# Patient Record
Sex: Male | Born: 1980
Health system: Southern US, Community
[De-identification: ages and names within clinical notes are randomized; demographics above are authoritative.]

## PROBLEM LIST (undated history)

## (undated) HISTORY — PX: WISDOM TOOTH EXTRACTION: SHX21

## (undated) HISTORY — PX: POLYPECTOMY: SHX149

---

## 2001-07-13 ENCOUNTER — Ambulatory Visit (HOSPITAL_COMMUNITY): Admission: RE | Admit: 2001-07-13 | Discharge: 2001-07-13 | Payer: Self-pay | Admitting: Family Medicine

## 2001-07-13 ENCOUNTER — Encounter: Payer: Self-pay | Admitting: Family Medicine

## 2005-06-15 ENCOUNTER — Ambulatory Visit: Payer: Self-pay | Admitting: Internal Medicine

## 2005-06-27 ENCOUNTER — Encounter: Admission: RE | Admit: 2005-06-27 | Discharge: 2005-09-08 | Payer: Self-pay | Admitting: Internal Medicine

## 2005-12-02 ENCOUNTER — Ambulatory Visit: Payer: Self-pay | Admitting: Internal Medicine

## 2005-12-22 ENCOUNTER — Ambulatory Visit: Payer: Self-pay | Admitting: Internal Medicine

## 2006-01-10 ENCOUNTER — Ambulatory Visit: Payer: Self-pay | Admitting: Endocrinology

## 2007-07-05 ENCOUNTER — Ambulatory Visit: Payer: Self-pay | Admitting: Internal Medicine

## 2007-07-05 ENCOUNTER — Encounter: Payer: Self-pay | Admitting: *Deleted

## 2007-07-05 DIAGNOSIS — E291 Testicular hypofunction: Secondary | ICD-10-CM | POA: Insufficient documentation

## 2007-07-05 DIAGNOSIS — E039 Hypothyroidism, unspecified: Secondary | ICD-10-CM | POA: Insufficient documentation

## 2007-07-05 DIAGNOSIS — M549 Dorsalgia, unspecified: Secondary | ICD-10-CM

## 2007-07-05 DIAGNOSIS — E349 Endocrine disorder, unspecified: Secondary | ICD-10-CM | POA: Insufficient documentation

## 2007-07-05 HISTORY — DX: Endocrine disorder, unspecified: E34.9

## 2007-07-05 HISTORY — DX: Hypothyroidism, unspecified: E03.9

## 2007-07-05 HISTORY — DX: Dorsalgia, unspecified: M54.9

## 2007-07-05 HISTORY — DX: Testicular hypofunction: E29.1

## 2007-07-27 ENCOUNTER — Ambulatory Visit: Payer: Self-pay | Admitting: Internal Medicine

## 2007-07-27 LAB — CONVERTED CEMR LAB
Sex Hormone Binding: 14 nmol/L (ref 13–71)
Testosterone Free: 123.1 pg/mL (ref 47.0–244.0)
Testosterone-% Free: 3 % — ABNORMAL HIGH (ref 1.6–2.9)
Testosterone: 409.65 ng/dL (ref 350–890)

## 2007-08-03 LAB — CONVERTED CEMR LAB
TSH: 1.12 microintl units/mL (ref 0.35–5.50)
Testosterone: 401.63 ng/dL (ref 350.00–890)

## 2016-09-04 DIAGNOSIS — J302 Other seasonal allergic rhinitis: Secondary | ICD-10-CM

## 2016-09-04 DIAGNOSIS — J3089 Other allergic rhinitis: Secondary | ICD-10-CM

## 2016-09-04 HISTORY — DX: Other seasonal allergic rhinitis: J30.2

## 2016-09-04 HISTORY — DX: Other allergic rhinitis: J30.89

## 2017-05-16 DIAGNOSIS — E785 Hyperlipidemia, unspecified: Secondary | ICD-10-CM

## 2017-05-16 DIAGNOSIS — IMO0001 Reserved for inherently not codable concepts without codable children: Secondary | ICD-10-CM | POA: Insufficient documentation

## 2017-05-16 DIAGNOSIS — Z0000 Encounter for general adult medical examination without abnormal findings: Secondary | ICD-10-CM | POA: Insufficient documentation

## 2017-05-16 HISTORY — DX: Reserved for inherently not codable concepts without codable children: IMO0001

## 2017-05-16 HISTORY — DX: Hyperlipidemia, unspecified: E78.5

## 2017-06-23 ENCOUNTER — Telehealth (HOSPITAL_COMMUNITY): Payer: Self-pay | Admitting: Lactation Services

## 2018-03-29 DIAGNOSIS — L239 Allergic contact dermatitis, unspecified cause: Secondary | ICD-10-CM

## 2018-03-29 HISTORY — DX: Allergic contact dermatitis, unspecified cause: L23.9

## 2018-08-09 ENCOUNTER — Ambulatory Visit: Payer: Self-pay | Admitting: Family Medicine

## 2019-01-28 ENCOUNTER — Emergency Department (HOSPITAL_COMMUNITY)
Admission: EM | Admit: 2019-01-28 | Discharge: 2019-01-29 | Disposition: A | Discharge status: Home / Self Care | Payer: Self-pay | Attending: Emergency Medicine | Admitting: Emergency Medicine

## 2019-01-28 ENCOUNTER — Other Ambulatory Visit: Payer: Self-pay

## 2019-01-28 ENCOUNTER — Encounter (HOSPITAL_COMMUNITY): Payer: Self-pay

## 2019-01-28 ENCOUNTER — Emergency Department (HOSPITAL_COMMUNITY): Payer: Self-pay

## 2019-01-28 DIAGNOSIS — M79602 Pain in left arm: Secondary | ICD-10-CM | POA: Insufficient documentation

## 2019-01-28 DIAGNOSIS — R0789 Other chest pain: Secondary | ICD-10-CM | POA: Insufficient documentation

## 2019-01-28 DIAGNOSIS — F419 Anxiety disorder, unspecified: Secondary | ICD-10-CM | POA: Insufficient documentation

## 2019-01-28 DIAGNOSIS — R079 Chest pain, unspecified: Secondary | ICD-10-CM

## 2019-01-28 DIAGNOSIS — R42 Dizziness and giddiness: Secondary | ICD-10-CM | POA: Insufficient documentation

## 2019-01-28 DIAGNOSIS — E039 Hypothyroidism, unspecified: Secondary | ICD-10-CM | POA: Insufficient documentation

## 2019-01-28 DIAGNOSIS — R61 Generalized hyperhidrosis: Secondary | ICD-10-CM | POA: Insufficient documentation

## 2019-01-28 LAB — BASIC METABOLIC PANEL
Anion gap: 9 (ref 5–15)
BUN: 14 mg/dL (ref 6–20)
CO2: 25 mmol/L (ref 22–32)
Calcium: 9.5 mg/dL (ref 8.9–10.3)
Chloride: 104 mmol/L (ref 98–111)
Creatinine, Ser: 1.08 mg/dL (ref 0.61–1.24)
GFR calc Af Amer: >60 mL/min (ref >60)
GFR calc non Af Amer: >60 mL/min (ref >60)
Glucose, Bld: 121 mg/dL — ABNORMAL HIGH (ref 70–99)
Potassium: 3.5 mmol/L (ref 3.5–5.1)
Sodium: 138 mmol/L (ref 135–145)

## 2019-01-28 LAB — CBC
HCT: 44.8 % (ref 39.0–52.0)
Hemoglobin: 15.4 g/dL (ref 13.0–17.0)
MCH: 32.9 pg (ref 26.0–34.0)
MCHC: 34.4 g/dL (ref 30.0–36.0)
MCV: 95.7 fL (ref 80.0–100.0)
Platelets: 192 10*3/uL (ref 150–400)
RBC: 4.68 MIL/uL (ref 4.22–5.81)
RDW: 12.5 % (ref 11.5–15.5)
WBC: 7.9 10*3/uL (ref 4.0–10.5)
nRBC: 0 % (ref 0.0–0.2)

## 2019-01-28 LAB — TROPONIN I: Troponin I: <0.03 ng/mL (ref <0.03)

## 2019-01-28 MED ORDER — SODIUM CHLORIDE 0.9% FLUSH: 3.0000 mL | Freq: Once | INTRAVENOUS | Status: DC

## 2019-01-28 NOTE — ED Triage Notes (Signed)
Pt states while sitting on the couch he had chest pressure with dizzy and lightheadedness for a few minutes; Pt denies pain on arrival.  Pt a&ox 4 on arrival. No complaints on arrival-Monique,RN

## 2019-01-29 LAB — TROPONIN I: Troponin I: <0.03 ng/mL (ref <0.03)

## 2019-01-29 NOTE — ED Provider Notes (Signed)
[facility] EMERGENCY DEPARTMENT Provider Note   CSN: [id number] Arrival date & time: 01/28/19  2126    History   Chief Complaint Chief Complaint  Patient presents with  . Chest Pain    HPI Carlos Hendricks is a 38 y.o. male.     The history is provided by the patient and medical records.  Chest Pain    38 year old male with history of hypothyroidism, presenting to the ED with chest pain.  States tonight around 9 PM he was sitting on the couch and had episode of left-sided chest pain, left arm pain, lightheadedness, and some mild sweating.  States this lasted for 1 minute or less before resolving spontaneously.  States he has felt fine for the past several hours but was very concerned by nature of his symptoms.  He has not had any recurrence of his symptoms.  He denies any known cardiac history.  Does have family cardiac history in both parents at older ages.  No sudden cardiac death.  He is not a smoker.  Denies illicit drug use.   No history of DVT or PE.  No fever, cough, URI symptoms, or sick contacts.  History reviewed. No pertinent past medical history.  Patient Active Problem List   Diagnosis Date Noted  . HYPOTHYROIDISM 07/05/2007  . HYPOGONADISM 07/05/2007  . BACK PAIN 07/05/2007    History reviewed. No pertinent surgical history.      Home Medications    Prior to Admission medications   Not on File    Family History No family history on file.  Social History Social History   Tobacco Use  . Smoking status: Not on file  Substance Use Topics  . Alcohol use: Not Currently  . Drug use: Not Currently     Allergies   Patient has no allergy information on record.   Review of Systems Review of Systems  Cardiovascular: Positive for chest pain.  All other systems reviewed and are negative.    Physical Exam Updated Vital Signs BP (!) 144/90 (BP Location: Right Arm)   Pulse (!) 105   Temp 98.2 F (36.8 C) (Oral)   Resp 18    Ht 5\' 10"  (1.778 m)   Wt 120.2 kg   SpO2 99%   BMI 38.02 kg/m   Physical Exam Vitals signs and nursing note reviewed.  Constitutional:      Appearance: He is well-developed.     Comments: obese  HENT:     Head: Normocephalic and atraumatic.  Eyes:     Conjunctiva/sclera: Conjunctivae normal.     Pupils: Pupils are equal, round, and reactive to light.  Neck:     Musculoskeletal: Normal range of motion.  Cardiovascular:     Rate and Rhythm: Normal rate and regular rhythm.     Heart sounds: Normal heart sounds.     Comments: HR variable between 97-105 during exam, sinus Pulmonary:     Effort: Pulmonary effort is normal.     Breath sounds: Normal breath sounds.  Abdominal:     General: Bowel sounds are normal.     Palpations: Abdomen is soft.  Musculoskeletal: Normal range of motion.  Skin:    General: Skin is warm and dry.  Neurological:     Mental Status: He is alert and oriented to person, place, and time.  Psychiatric:     Comments: Appears a little anxious during exam      ED Treatments / Results  Labs (all labs ordered are  listed, but only abnormal results are displayed) Labs Reviewed  BASIC METABOLIC PANEL - Abnormal; Notable for the following components:      Result Value   Glucose, Bld 121 (*)    All other components within normal limits  CBC  TROPONIN I  TROPONIN I    EKG EKG Interpretation  Date/Time:  Monday January 28 2019 21:28:40 EDT Ventricular Rate:  100 PR Interval:  144 QRS Duration: 106 QT Interval:  366 QTC Calculation: 472 R Axis:   93 Text Interpretation:  Normal sinus rhythm Rightward axis Incomplete right bundle branch block Borderline ECG intermittent QRS widening NO STEMI. No old tracing to compare Confirmed by Drema Pryardama, Pedro [phone]([id number]) on 01/28/2019 11:42:08 PM   Radiology Dg Chest 2 View  Result Date: 01/28/2019 CLINICAL DATA:  Chest pain EXAM: CHEST - 2 VIEW COMPARISON:  None. FINDINGS: Heart and mediastinal contours are within  normal limits. No focal opacities or effusions. No acute bony abnormality. IMPRESSION: No active cardiopulmonary disease. Electronically Signed   By: Charlett NoseKevin  Dover M.D.   On: 01/28/2019 22:22    Procedures Procedures (including critical care time)  Medications Ordered in ED Medications  sodium chloride flush (NS) 0.9 % injection 3 mL (3 mLs Intravenous Not Given 01/29/19 0123)     Initial Impression / Assessment and Plan / ED Course  I have reviewed the triage vital signs and the nursing notes.  Pertinent labs & imaging results that were available during my care of the patient were reviewed by me and considered in my medical decision making (see chart for details).  38 y.o. M here after episode of chest pain while sitting on the couch.  Reports left sided chest pain, left arm pain, lightheadedness, and sweating.  Symptoms lasted <1 min before resolving.  No recurence since and has been feeling fine.  EKG non-ischemic here.  Mild tachycardia, however patient admits he is nervous and appear mildly anxious.  Labs reassuring, trop negative.  CXR clear.  Patient without significant cardiac risk factors aside from obesity and family history.  Delta trop remains negative.  Low suspicion for ACS, PE, dissection, or other acute cardiac event.  Patient HR has returned to normal without intervention here.  Feel he is stable for discharge home.  Can follow-up as an OP.  He will return here for any new/acute changes.  Final Clinical Impressions(s) / ED Diagnoses   Final diagnoses:  Chest pain in adult    ED Discharge Orders    None       Garlon HatchetSanders,  M, PA-C 01/29/19 0125    Nira Connardama, Pedro Eduardo, MD 01/29/19 [phone]

## 2019-01-29 NOTE — Discharge Instructions (Signed)
Cardiac evaluation today was normal. You can return here for any new/acute changes.

## 2020-05-25 ENCOUNTER — Other Ambulatory Visit: Payer: Self-pay

## 2020-05-25 ENCOUNTER — Encounter (HOSPITAL_COMMUNITY): Payer: Self-pay | Admitting: Emergency Medicine

## 2020-05-25 ENCOUNTER — Emergency Department (HOSPITAL_COMMUNITY)
Admission: EM | Admit: 2020-05-25 | Discharge: 2020-05-25 | Disposition: A | Discharge status: Home / Self Care | Payer: BC Managed Care – PPO | Attending: Emergency Medicine | Admitting: Emergency Medicine

## 2020-05-25 ENCOUNTER — Emergency Department (HOSPITAL_COMMUNITY): Payer: BC Managed Care – PPO

## 2020-05-25 DIAGNOSIS — R079 Chest pain, unspecified: Secondary | ICD-10-CM | POA: Diagnosis present

## 2020-05-25 DIAGNOSIS — R200 Anesthesia of skin: Secondary | ICD-10-CM | POA: Diagnosis not present

## 2020-05-25 DIAGNOSIS — Z5321 Procedure and treatment not carried out due to patient leaving prior to being seen by health care provider: Secondary | ICD-10-CM | POA: Insufficient documentation

## 2020-05-25 LAB — BASIC METABOLIC PANEL
Anion gap: 11 (ref 5–15)
BUN: 11 mg/dL (ref 6–20)
CO2: 23 mmol/L (ref 22–32)
Calcium: 9.4 mg/dL (ref 8.9–10.3)
Chloride: 106 mmol/L (ref 98–111)
Creatinine, Ser: 1.09 mg/dL (ref 0.61–1.24)
GFR calc Af Amer: >60 mL/min (ref >60)
GFR calc non Af Amer: >60 mL/min (ref >60)
Glucose, Bld: 114 mg/dL — ABNORMAL HIGH (ref 70–99)
Potassium: 4 mmol/L (ref 3.5–5.1)
Sodium: 140 mmol/L (ref 135–145)

## 2020-05-25 LAB — TROPONIN I (HIGH SENSITIVITY)
Troponin I (High Sensitivity): <2 ng/L (ref <18)
Troponin I (High Sensitivity): <2 ng/L (ref <18)

## 2020-05-25 LAB — CBC
HCT: 44.4 % (ref 39.0–52.0)
Hemoglobin: 15 g/dL (ref 13.0–17.0)
MCH: 33.3 pg (ref 26.0–34.0)
MCHC: 33.8 g/dL (ref 30.0–36.0)
MCV: 98.7 fL (ref 80.0–100.0)
Platelets: 211 10*3/uL (ref 150–400)
RBC: 4.5 MIL/uL (ref 4.22–5.81)
RDW: 12.2 % (ref 11.5–15.5)
WBC: 7.6 10*3/uL (ref 4.0–10.5)
nRBC: 0 % (ref 0.0–0.2)

## 2020-05-25 NOTE — ED Notes (Signed)
Pt gave stickers to another NT and stated he would follow up with primary in the morning.

## 2020-05-25 NOTE — ED Triage Notes (Signed)
Pt to ED with c/o left chest pain off and on describes as sharp   Also st's left hand felt numb.  Pt denies any pain at this time

## 2020-05-26 ENCOUNTER — Encounter (HOSPITAL_COMMUNITY): Payer: Self-pay

## 2020-05-26 ENCOUNTER — Emergency Department (HOSPITAL_COMMUNITY)
Admission: EM | Admit: 2020-05-26 | Discharge: 2020-05-26 | Disposition: A | Discharge status: Home / Self Care | Payer: BC Managed Care – PPO | Attending: Emergency Medicine | Admitting: Emergency Medicine

## 2020-05-26 ENCOUNTER — Other Ambulatory Visit: Payer: Self-pay

## 2020-05-26 DIAGNOSIS — R079 Chest pain, unspecified: Secondary | ICD-10-CM

## 2020-05-26 DIAGNOSIS — Z7982 Long term (current) use of aspirin: Secondary | ICD-10-CM | POA: Diagnosis not present

## 2020-05-26 DIAGNOSIS — R200 Anesthesia of skin: Secondary | ICD-10-CM | POA: Diagnosis not present

## 2020-05-26 DIAGNOSIS — R002 Palpitations: Secondary | ICD-10-CM | POA: Diagnosis not present

## 2020-05-26 DIAGNOSIS — E039 Hypothyroidism, unspecified: Secondary | ICD-10-CM | POA: Diagnosis not present

## 2020-05-26 DIAGNOSIS — R0789 Other chest pain: Secondary | ICD-10-CM | POA: Insufficient documentation

## 2020-05-26 DIAGNOSIS — R42 Dizziness and giddiness: Secondary | ICD-10-CM | POA: Diagnosis not present

## 2020-05-26 LAB — TROPONIN I (HIGH SENSITIVITY)
Troponin I (High Sensitivity): <2 ng/L (ref <18)
Troponin I (High Sensitivity): <2 ng/L (ref <18)

## 2020-05-26 NOTE — ED Provider Notes (Signed)
[facility] EMERGENCY DEPARTMENT Provider Note   CSN: [id number] Arrival date & time: 05/26/20  1354     History No chief complaint on file.   BARBARA KENG is a 39 y.o. male.  He has no significant past medical history.  Non-smoker.  Complaining of a few episodes over the last few days feeling a skipped beat and some sharp left-sided chest pain with associated lightheadedness and numbness in his left arm.  Was here yesterday for same had 2 troponins in the waiting room and ultimately left.  Saw the doctor at [facility] today and was told he is having an acute coronary syndrome and should need to come back to the ER.  Patient currently denies any symptoms.  He had a full set of labs yesterday with 2 troponins and a chest x-ray.  He has had a troponin here today and a second 1 is pending.  No prior history of coronary disease.  Denies any cocaine.    The history is provided by the patient and the spouse.  Chest Pain Pain location:  L chest Pain quality: sharp and stabbing   Pain radiates to:  Does not radiate Pain severity:  Moderate Onset quality:  Sudden Timing:  Sporadic Progression:  Unchanged Chronicity:  New Context: at rest   Relieved by:  Nothing Worsened by:  Nothing Ineffective treatments:  None tried Associated symptoms: dizziness, nausea, numbness and palpitations   Associated symptoms: no abdominal pain, no cough, no diaphoresis, no fever, no lower extremity edema, no shortness of breath, no vomiting and no weakness   Risk factors: male sex   Risk factors: no hypertension and no smoking     HPI: A 39 year old patient with a history of hypercholesterolemia and obesity presents for evaluation of chest pain. Initial onset of pain was more than 6 hours ago. The patient's chest pain is sharp and is not worse with exertion. The patient complains of nausea. The patient's chest pain is middle- or left-sided, is not well-localized, is not  described as heaviness/pressure/tightness and does not radiate to the arms/jaw/neck. The patient denies diaphoresis. The patient has no history of stroke, has no history of peripheral artery disease, has not smoked in the past 90 days, denies any history of treated diabetes, has no relevant family history of coronary artery disease (first degree relative at less than age 74) and is not hypertensive.   History reviewed. No pertinent past medical history.  Patient Active Problem List   Diagnosis Date Noted  . HYPOTHYROIDISM 07/05/2007  . HYPOGONADISM 07/05/2007  . BACK PAIN 07/05/2007    History reviewed. No pertinent surgical history.     No family history on file.  Social History   Tobacco Use  . Smoking status: Never Smoker  . Smokeless tobacco: Never Used  Substance Use Topics  . Alcohol use: Yes    Comment: rarely  . Drug use: Not Currently    Home Medications Prior to Admission medications   Medication Sig Start Date End Date Taking? Authorizing Provider  aspirin 81 MG chewable tablet Chew 243 mg by mouth once.    [provider]    Allergies    Patient has no known allergies.  Review of Systems   Review of Systems  Constitutional: Negative for diaphoresis and fever.  HENT: Negative for sore throat.   Eyes: Negative for visual disturbance.  Respiratory: Negative for cough and shortness of breath.   Cardiovascular: Positive for chest pain and palpitations.  Gastrointestinal: Positive for nausea. Negative for abdominal pain and vomiting.  Genitourinary: Negative for dysuria.  Musculoskeletal: Negative for neck pain.  Skin: Negative for rash.  Neurological: Positive for dizziness and numbness. Negative for weakness.    Physical Exam Updated Vital Signs BP (!) 139/92   Pulse 87   Temp 98.1 F (36.7 C) (Oral)   Resp 17   Ht 5\' 10"  (1.778 m)   Wt 131.5 kg   SpO2 100%   BMI 41.61 kg/m   Physical Exam Vitals and nursing note reviewed.    Constitutional:      Appearance: Normal appearance. He is well-developed.  HENT:     Head: Normocephalic and atraumatic.  Eyes:     Conjunctiva/sclera: Conjunctivae normal.  Cardiovascular:     Rate and Rhythm: Normal rate and regular rhythm.     Heart sounds: No murmur heard.   Pulmonary:     Effort: Pulmonary effort is normal. No respiratory distress.     Breath sounds: Normal breath sounds.  Abdominal:     Palpations: Abdomen is soft.     Tenderness: There is no abdominal tenderness.  Musculoskeletal:        General: Normal range of motion.     Cervical back: Neck supple.     Right lower leg: No edema.     Left lower leg: No edema.     Comments: No cords  Skin:    General: Skin is warm and dry.     Capillary Refill: Capillary refill takes less than 2 seconds.  Neurological:     General: No focal deficit present.     Mental Status: He is alert.     ED Results / Procedures / Treatments   Labs (all labs ordered are listed, but only abnormal results are displayed) Labs Reviewed  TROPONIN I (HIGH SENSITIVITY)  TROPONIN I (HIGH SENSITIVITY)    EKG EKG Interpretation  Date/Time:  Tuesday May 26 2020 14:01:52 EDT Ventricular Rate:  78 PR Interval:  150 QRS Duration: 128 QT Interval:  402 QTC Calculation: 458 R Axis:   98 Text Interpretation: Normal sinus rhythm Right bundle branch block Abnormal ECG No significant change since prior yesterday Confirmed by 01-15-2004 [phone]) on 05/26/2020 8:12:48 PM   Radiology DG Chest 2 View  Result Date: 05/25/2020 CLINICAL DATA:  Chest pain EXAM: CHEST - 2 VIEW COMPARISON:  01/28/2019 chest radiograph. FINDINGS: Stable cardiomediastinal silhouette with normal heart size. No pneumothorax. No pleural effusion. Lungs appear clear, with no acute consolidative airspace disease and no pulmonary edema. IMPRESSION: No active cardiopulmonary disease. Electronically Signed   By: 01/30/2019 M.D.   On: 05/25/2020 20:20     Procedures Procedures (including critical care time)  Medications Ordered in ED Medications - No data to display  ED Course  I have reviewed the triage vital signs and the nursing notes.  Pertinent labs & imaging results that were available during my care of the patient were reviewed by me and considered in my medical decision making (see chart for details).  Clinical Course as of May 27 1046  Tue May 26, 2020  2035 He is PERC negative.   [MB]  2054 Reviewed case with cardiology on-call Dr. [id number].  He did not feel that the progression to a full right bundle branch block was indicative of coronary disease.  He is in agreement with plan for outpatient referral to cardiology.   [MB]    Clinical Course User Index [MB] Izora Ribas, MD  MDM Rules/Calculators/A&P HEAR Score: 2                       This patient complains of palpitations chest pain lightheadedness; this involves an extensive number of treatment Options and is a complaint that carries with it a high risk of complications and Morbidity. The differential includes ACS, arrhythmia, pneumothorax, pneumonia, GERD, vascular, PE  I ordered, reviewed and interpreted labs, which included 2 troponins unremarkable I reviewed the chest x-ray had lab work done yesterday that was unremarkable Additional history obtained from patient's spouse Previous records obtained and reviewed in epic, did have an ED visit a year ago for chest pain and ruled out with 2 troponins  After the interventions stated above, I reevaluated the patient and found the patient currently be pain-free with normal vitals.  I reviewed his work-up with him and his wife.  He is comfortable with plan for outpatient referral to cardiology.  Return instructions discussed.   Final Clinical Impression(s) / ED Diagnoses Final diagnoses:  Nonspecific chest pain  Heart palpitations    Rx / DC Orders ED Discharge Orders         Ordered    Ambulatory  referral to Cardiology     Discontinue  Reprint     05/26/20 2118           Terrilee Files, MD 05/27/20 1049

## 2020-05-26 NOTE — Discharge Instructions (Addendum)
You were seen in the emergency department for evaluation of chest pain palpitations lightheadedness.  Between yesterday and today you had blood work chest x-ray EKG.  There was no evidence of any cardiac injury.  We have placed a referral for you to follow-up with cardiology.  Please take a baby aspirin a day.  Return to the emergency department with any worsening or concerning symptoms.

## 2020-05-26 NOTE — ED Triage Notes (Signed)
Patient here yesterday with CP and left prior to being seen. Went to his MD today and sent back to ED for abnormal EKG. No pain but reports intermittent light headedness. Alert and oriented, NAD

## 2020-05-27 ENCOUNTER — Encounter: Payer: Self-pay | Admitting: *Deleted

## 2020-05-27 ENCOUNTER — Ambulatory Visit: Payer: BC Managed Care – PPO | Admitting: Cardiology

## 2020-05-27 ENCOUNTER — Other Ambulatory Visit: Payer: Self-pay | Admitting: *Deleted

## 2020-05-27 ENCOUNTER — Ambulatory Visit (INDEPENDENT_AMBULATORY_CARE_PROVIDER_SITE_OTHER): Payer: BC Managed Care – PPO

## 2020-05-27 VITALS — BP 104/76 | HR 88 | Ht 70.0 in | Wt 291.0 lb

## 2020-05-27 DIAGNOSIS — I4510 Unspecified right bundle-branch block: Secondary | ICD-10-CM

## 2020-05-27 DIAGNOSIS — R42 Dizziness and giddiness: Secondary | ICD-10-CM

## 2020-05-27 DIAGNOSIS — R079 Chest pain, unspecified: Secondary | ICD-10-CM | POA: Insufficient documentation

## 2020-05-27 DIAGNOSIS — R600 Localized edema: Secondary | ICD-10-CM | POA: Diagnosis not present

## 2020-05-27 DIAGNOSIS — E785 Hyperlipidemia, unspecified: Secondary | ICD-10-CM | POA: Diagnosis not present

## 2020-05-27 DIAGNOSIS — R072 Precordial pain: Secondary | ICD-10-CM | POA: Diagnosis not present

## 2020-05-27 MED ORDER — NITROGLYCERIN 0.4 MG SL SUBL: 0.4000 mg | SUBLINGUAL_TABLET | SUBLINGUAL | 6 refills | Status: DC | PRN

## 2020-05-27 MED ORDER — METOPROLOL TARTRATE 100 MG PO TABS: 100.0000 mg | ORAL_TABLET | Freq: Once | ORAL | 0 refills | Status: DC

## 2020-05-27 NOTE — Patient Instructions (Signed)
Medication Instructions:  Your physician has recommended you make the following change in your medication:   Take Nitroglycerin as needed for chest pain.  *If you need a refill on your cardiac medications before your next appointment, please call your pharmacy*   Lab Work: Your physician recommends that you return for lab work in: 1 week prior to your CT you will need a BMET. You also need a lipid profile when fasting.  You need to have labs done when you are fasting.  You can come Monday through Friday 8:30 am to 12:00 pm and 1:15 to 4:30. You do not need to make an appointment as the order has already been placed.   If you have labs (blood work) drawn today and your tests are completely normal, you will receive your results only by:  [place] (if you have MyChart) OR  A paper copy in the mail If you have any lab test that is abnormal or we need to change your treatment, we will call you to review the results.   Testing/Procedures: Your physician has requested that you have an echocardiogram. Echocardiography is a painless test that uses sound waves to create images of your heart. It provides your doctor with information about the size and shape of your heart and how well your hearts chambers and valves are working. This procedure takes approximately one hour. There are no restrictions for this procedure.   WHY IS MY DOCTOR PRESCRIBING ZIO? The Zio system is proven and trusted by physicians to detect and diagnose irregular heart rhythms -- and has been prescribed to hundreds of thousands of patients.  The FDA has cleared the Zio system to monitor for many different kinds of irregular heart rhythms. In a study, physicians were able to reach a diagnosis 90% of the time with the Zio system1.  You can wear the Zio monitor -- a small, discreet, comfortable patch -- during your normal day-to-day activity, including while you sleep, shower, and exercise, while it records every single  heartbeat for analysis.  1Barrett, P., et al. Comparison of 24 Hour Holter Monitoring Versus 14 Day Novel Adhesive Patch Electrocardiographic Monitoring. [place], 2014.  ZIO VS. HOLTER MONITORING The Zio monitor can be comfortably worn for up to 14 days. Holter monitors can be worn for 24 to 48 hours, limiting the time to record any irregular heart rhythms you may have. Zio is able to capture data for the 51% of patients who have their first symptom-triggered arrhythmia after 48 hours.1  LIVE WITHOUT RESTRICTIONS The Zio ambulatory cardiac monitor is a small, unobtrusive, and water-resistant patch--you might even forget youre wearing it. The Zio monitor records and stores every beat of your heart, whether you're sleeping, working out, or showering.  Wear the monitor for 2 weeks, remove 06/10/20.    Your cardiac CT will be scheduled at:   [facility] [place], [place] [address] [phone]   If scheduled at [facility], please arrive at the [facility] main entrance of [facility] 30 minutes prior to test start time. Proceed to the [facility] Radiology Department (first floor) to check-in and test prep.  Please follow these instructions carefully (unless otherwise directed):  Hold all erectile dysfunction medications at least 3 days (72 hrs) prior to test.  On the Night Before the Test:  Be sure to Drink plenty of water.  Do not consume any caffeinated/decaffeinated beverages or chocolate 12 hours prior to your test.  Do not take any antihistamines 12 hours prior to your test.  On the Day of the Test:  Drink plenty of water. Do not drink any water within one hour of the test.  Do not eat any food 4 hours prior to the test.  You may take your regular medications prior to the test.   Take metoprolol (Lopressor) two hours prior to test.    After the Test:  Drink plenty of water.  After receiving IV  contrast, you may experience a mild flushed feeling. This is normal.  On occasion, you may experience a mild rash up to 24 hours after the test. This is not dangerous. If this occurs, you can take Benadryl 25 mg and increase your fluid intake.  If you experience trouble breathing, this can be serious. If it is severe call 911 IMMEDIATELY. If it is mild, please call our office.  Once we have confirmed authorization from your insurance company, we will call you to set up a date and time for your test. Based on how quickly your insurance processes prior authorizations requests, please allow up to 4 weeks to be contacted for scheduling your Cardiac CT appointment. Be advised that routine Cardiac CT appointments could be scheduled as many as 8 weeks after your provider has ordered it.  For non-scheduling related questions, please contact the cardiac imaging nurse navigator should you have any questions/concerns: Marchia Bond, Cardiac Imaging Nurse Navigator Burley Saver, Interim Cardiac Imaging Nurse Lane and Vascular Services Direct Office Dial: [phone]   For scheduling needs, including cancellations and rescheduling, please call Vivien Rota at [phone].     Follow-Up: At [facility], you and your health needs are our priority.  As part of our continuing mission to provide you with exceptional heart care, we have created designated Provider Care Teams.  These Care Teams include your primary Cardiologist (physician) and Advanced Practice Providers (APPs -  Physician Assistants and Nurse Practitioners) who all work together to provide you with the care you need, when you need it.  We recommend signing up for the patient portal called "MyChart".  Sign up information is provided on this After Visit Summary.  MyChart is used to connect with patients for Virtual Visits (Telemedicine).  Patients are able to view lab/test results, encounter notes, upcoming appointments, etc.   Non-urgent messages can be sent to your provider as well.   To learn more about what you can do with MyChart, go to [email or web address].    Your next appointment:   3 month(s)  The format for your next appointment:   In Person  Provider:   Berniece Salines, DO   Other Instructions Cardiac CT Angiogram A cardiac CT angiogram is a procedure to look at the heart and the area around the heart. It may be done to help find the cause of chest pains or other symptoms of heart disease. During this procedure, a substance called contrast dye is injected into the blood vessels in the area to be checked. A large X-ray machine, called a CT scanner, then takes detailed pictures of the heart and the surrounding area. The procedure is also sometimes called a coronary CT angiogram, coronary artery scanning, or CTA. A cardiac CT angiogram allows the health care provider to see how well blood is flowing to and from the heart. The health care provider will be able to see if there are any problems, such as:  Blockage or narrowing of the coronary arteries in the heart.  Fluid  around the heart.  Signs of weakness or disease in the muscles, valves, and tissues of the heart. Tell a health care provider about:  Any allergies you have. This is especially important if you have had a previous allergic reaction to contrast dye.  All medicines you are taking, including vitamins, herbs, eye drops, creams, and over-the-counter medicines.  Any blood disorders you have.  Any surgeries you have had.  Any medical conditions you have.  Whether you are pregnant or may be pregnant.  Any anxiety disorders, chronic pain, or other conditions you have that may increase your stress or prevent you from lying still. What are the risks? Generally, this is a safe procedure. However, problems may occur, including: 1. Bleeding. 2. Infection. 3. Allergic reactions to medicines or dyes. 4. Damage to other structures or  organs. 5. Kidney damage from the contrast dye that is used. 6. Increased risk of cancer from radiation exposure. This risk is low. Talk with your health care provider about: ? The risks and benefits of testing. ? How you can receive the lowest dose of radiation. What happens before the procedure? 1. Wear comfortable clothing and remove any jewelry, glasses, dentures, and hearing aids. 2. Follow instructions from your health care provider about eating and drinking. This may include: ? For 12 hours before the procedure -- avoid caffeine. This includes tea, coffee, soda, energy drinks, and diet pills. Drink plenty of water or other fluids that do not have caffeine in them. Being well hydrated can prevent complications. ? For 4-6 hours before the procedure -- stop eating and drinking. The contrast dye can cause nausea, but this is less likely if your stomach is empty. 3. Ask your health care provider about changing or stopping your regular medicines. This is especially important if you are taking diabetes medicines, blood thinners, or medicines to treat problems with erections (erectile dysfunction). What happens during the procedure?  1. Hair on your chest may need to be removed so that small sticky patches called electrodes can be placed on your chest. These will transmit information that helps to monitor your heart during the procedure. 2. An IV will be inserted into one of your veins. 3. You might be given a medicine to control your heart rate during the procedure. This will help to ensure that good images are obtained. 4. You will be asked to lie on an exam table. This table will slide in and out of the CT machine during the procedure. 5. Contrast dye will be injected into the IV. You might feel warm, or you may get a metallic taste in your mouth. 6. You will be given a medicine called nitroglycerin. This will relax or dilate the arteries in your heart. 7. The table that you are lying on will  move into the CT machine tunnel for the scan. 8. The person running the machine will give you instructions while the scans are being done. You may be asked to: ? Keep your arms above your head. ? Hold your breath. ? Stay very still, even if the table is moving. 9. When the scanning is complete, you will be moved out of the machine. 10. The IV will be removed. The procedure may vary among health care providers and hospitals. What can I expect after the procedure? After your procedure, it is common to have:  A metallic taste in your mouth from the contrast dye.  A feeling of warmth.  A headache from the nitroglycerin. Follow these instructions at  home:  Take over-the-counter and prescription medicines only as told by your health care provider.  If you are told, drink enough fluid to keep your urine pale yellow. This will help to flush the contrast dye out of your body.  Most people can return to their normal activities right after the procedure. Ask your health care provider what activities are safe for you.  It is up to you to get the results of your procedure. Ask your health care provider, or the department that is doing the procedure, when your results will be ready.  Keep all follow-up visits as told by your health care provider. This is important. Contact a health care provider if: 1. You have any symptoms of allergy to the contrast dye. These include: ? Shortness of breath. ? Rash or hives. ? A racing heartbeat. Summary  A cardiac CT angiogram is a procedure to look at the heart and the area around the heart. It may be done to help find the cause of chest pains or other symptoms of heart disease.  During this procedure, a large X-ray machine, called a CT scanner, takes detailed pictures of the heart and the surrounding area after a contrast dye has been injected into blood vessels in the area.  Ask your health care provider about changing or stopping your regular medicines  before the procedure. This is especially important if you are taking diabetes medicines, blood thinners, or medicines to treat erectile dysfunction.  If you are told, drink enough fluid to keep your urine pale yellow. This will help to flush the contrast dye out of your body. This information is not intended to replace advice given to you by your health care provider. Make sure you discuss any questions you have with your health care provider. Document Revised: 05/22/2019 Document Reviewed: 05/22/2019 Elsevier Patient Education  [place].  Nitroglycerin sublingual tablets What is this medicine? NITROGLYCERIN (nye troe GLI ser in) is a type of vasodilator. It relaxes blood vessels, increasing the blood and oxygen supply to your heart. This medicine is used to relieve chest pain caused by angina. It is also used to prevent chest pain before activities like climbing stairs, going outdoors in cold weather, or sexual activity. This medicine may be used for other purposes; ask your health care provider or pharmacist if you have questions. COMMON BRAND NAME(S): Nitroquick, Nitrostat, Nitrotab What should I tell my health care provider before I take this medicine? They need to know if you have any of these conditions:  anemia  head injury, recent stroke, or bleeding in the brain  liver disease  previous heart attack  an unusual or allergic reaction to nitroglycerin, other medicines, foods, dyes, or preservatives  pregnant or trying to get pregnant  breast-feeding How should I use this medicine? Take this medicine by mouth as needed. At the first sign of an angina attack (chest pain or tightness) place one tablet under your tongue. You can also take this medicine 5 to 10 minutes before an event likely to produce chest pain. Follow the directions on the prescription label. Let the tablet dissolve under the tongue. Do not swallow whole. Replace the dose if you accidentally swallow it. It  will help if your mouth is not dry. Saliva around the tablet will help it to dissolve more quickly. Do not eat or drink, smoke or chew tobacco while a tablet is dissolving. If you are not better within 5 minutes after taking ONE dose of nitroglycerin, call 9-1-1  immediately to seek emergency medical care. Do not take more than 3 nitroglycerin tablets over 15 minutes. If you take this medicine often to relieve symptoms of angina, your doctor or health care professional may provide you with different instructions to manage your symptoms. If symptoms do not go away after following these instructions, it is important to call 9-1-1 immediately. Do not take more than 3 nitroglycerin tablets over 15 minutes. Talk to your pediatrician regarding the use of this medicine in children. Special care may be needed. Overdosage: If you think you have taken too much of this medicine contact a poison control center or emergency room at once. NOTE: This medicine is only for you. Do not share this medicine with others. What if I miss a dose? This does not apply. This medicine is only used as needed. What may interact with this medicine? Do not take this medicine with any of the following medications:  certain migraine medicines like ergotamine and dihydroergotamine (DHE)  medicines used to treat erectile dysfunction like sildenafil, tadalafil, and vardenafil  riociguat This medicine may also interact with the following medications:  alteplase  aspirin  heparin  medicines for high blood pressure  medicines for mental depression  other medicines used to treat angina  phenothiazines like chlorpromazine, mesoridazine, prochlorperazine, thioridazine This list may not describe all possible interactions. Give your health care provider a list of all the medicines, herbs, non-prescription drugs, or dietary supplements you use. Also tell them if you smoke, drink alcohol, or use illegal drugs. Some items may interact  with your medicine. What should I watch for while using this medicine? Tell your doctor or health care professional if you feel your medicine is no longer working. Keep this medicine with you at all times. Sit or lie down when you take your medicine to prevent falling if you feel dizzy or faint after using it. Try to remain calm. This will help you to feel better faster. If you feel dizzy, take several deep breaths and lie down with your feet propped up, or bend forward with your head resting between your knees. You may get drowsy or dizzy. Do not drive, use machinery, or do anything that needs mental alertness until you know how this drug affects you. Do not stand or sit up quickly, especially if you are an older patient. This reduces the risk of dizzy or fainting spells. Alcohol can make you more drowsy and dizzy. Avoid alcoholic drinks. Do not treat yourself for coughs, colds, or pain while you are taking this medicine without asking your doctor or health care professional for advice. Some ingredients may increase your blood pressure. What side effects may I notice from receiving this medicine? Side effects that you should report to your doctor or health care professional as soon as possible:  blurred vision  dry mouth  skin rash  sweating  the feeling of extreme pressure in the head  unusually weak or tired Side effects that usually do not require medical attention (report to your doctor or health care professional if they continue or are bothersome):  flushing of the face or neck  headache  irregular heartbeat, palpitations  nausea, vomiting This list may not describe all possible side effects. Call your doctor for medical advice about side effects. You may report side effects to FDA at 1-800-FDA-1088. Where should I keep my medicine? Keep out of the reach of children. Store at room temperature between 20 and 25 degrees C (68 and 77 degrees F). Store  in original container.  Protect from light and moisture. Keep tightly closed. Throw away any unused medicine after the expiration date. NOTE: This sheet is a summary. It may not cover all possible information. If you have questions about this medicine, talk to your doctor, pharmacist, or health care provider.  2020 Elsevier/Gold Standard (2013-07-25 17:57:36)  Echocardiogram An echocardiogram is a procedure that uses painless sound waves (ultrasound) to produce an image of the heart. Images from an echocardiogram can provide important information about:  Signs of coronary artery disease (CAD).  Aneurysm detection. An aneurysm is a weak or damaged part of an artery wall that bulges out from the normal force of blood pumping through the body.  Heart size and shape. Changes in the size or shape of the heart can be associated with certain conditions, including heart failure, aneurysm, and CAD.  Heart muscle function.  Heart valve function.  Signs of a past heart attack.  Fluid buildup around the heart.  Thickening of the heart muscle.  A tumor or infectious growth around the heart valves. Tell a health care provider about:  Any allergies you have.  All medicines you are taking, including vitamins, herbs, eye drops, creams, and over-the-counter medicines.  Any blood disorders you have.  Any surgeries you have had.  Any medical conditions you have.  Whether you are pregnant or may be pregnant. What are the risks? Generally, this is a safe procedure. However, problems may occur, including:  Allergic reaction to dye (contrast) that may be used during the procedure. What happens before the procedure? No specific preparation is needed. You may eat and drink normally. What happens during the procedure?   An IV tube may be inserted into one of your veins.  You may receive contrast through this tube. A contrast is an injection that improves the quality of the pictures from your heart.  A gel will be  applied to your chest.  A wand-like tool (transducer) will be moved over your chest. The gel will help to transmit the sound waves from the transducer.  The sound waves will harmlessly bounce off of your heart to allow the heart images to be captured in real-time motion. The images will be recorded on a computer. The procedure may vary among health care providers and hospitals. What happens after the procedure?  You may return to your normal, everyday life, including diet, activities, and medicines, unless your health care provider tells you not to do that. Summary  An echocardiogram is a procedure that uses painless sound waves (ultrasound) to produce an image of the heart.  Images from an echocardiogram can provide important information about the size and shape of your heart, heart muscle function, heart valve function, and fluid buildup around your heart.  You do not need to do anything to prepare before this procedure. You may eat and drink normally.  After the echocardiogram is completed, you may return to your normal, everyday life, unless your health care provider tells you not to do that. This information is not intended to replace advice given to you by your health care provider. Make sure you discuss any questions you have with your health care provider. Document Revised: 01/17/2019 Document Reviewed: 10/29/2016 Elsevier Patient Education  [place].

## 2020-05-27 NOTE — Progress Notes (Signed)
Cardiology Office Note:    Date:  05/27/2020   ID:  Carlos Hendricks, DOB December 06, 1980, MRN [id number]  PCP:  Patient, No Pcp Per  Cardiologist:  No primary care provider on file.  Electrophysiologist:  None   Referring MD: Hayden Rasmussen, MD   Chief Complaint  Patient presents with  . Chest Pain   History of Present Illness:    Carlos Hendricks is a 39 y.o. male with a hx of hypertriglyceridemia was on medication in the past but no longer. He has not had his lipid profile done in over a year. The patient presents today to be evaluated for chest pain, lightheadedness/dizziness and chest flutter.  He tells me that he started last Thursday. He notes that he was driving back from work when he felt some intermittent chest flutter at which time he felt significantly lightheaded with some nausea. He stated it lasted for few minutes and then resolved. He didn't think much about it. The next day on Friday again while driving he felt sounded onset of lightheadedness he felt sweaty and at that time he felt some jolt in his chest but resolved. He did speak with his wife about the second episode therefore this started to wash the patient. On Saturday he tells me he was fine and is moving in his yard and had no problems.   On Monday he took his car to get fixed and while in another person's car he started to feel left-sided chest pain with left arm numbness. He then proceeded to go to the emergency department to be evaluated. While in emergency department he had serial troponins which were all reported to be within normal limits. His EKG showed evidence of right bundle branch block with no ST-T wave changes.  Patient is in office today with his wife. He is not experiencing any chest pain but states few minutes prior to my presenting to the room for encounter he had some lightheadedness.  Past Medical History:  Diagnosis Date  . Allergic contact dermatitis 03/29/2018  . BACK PAIN 07/05/2007    Qualifier: Diagnosis of  By: Larose Kells MD, [place] 3 obesity with body mass index (BMI) of 40.0 to 44.9 in adult 05/16/2017  . Hyperlipidemia 05/16/2017  . HYPOGONADISM 07/05/2007   Qualifier: Diagnosis of  By: Reatha Armour, Lucy    . Hypotestosteronism 07/05/2007  . HYPOTHYROIDISM 07/05/2007   Qualifier: Diagnosis of  By: Reatha Armour, Lucy    . Perennial allergic rhinitis with seasonal variation 09/04/2016    Past Surgical History:  Procedure Laterality Date  . POLYPECTOMY    . WISDOM TOOTH EXTRACTION      Current Medications: No outpatient medications have been marked as taking for the 05/27/20 encounter (Office Visit) with Berniece Salines, DO.     Allergies:   Patient has no known allergies.   Social History   Socioeconomic History  . Marital status: Married    Spouse name: Not on file  . Number of children: Not on file  . Years of education: Not on file  . Highest education level: Not on file  Occupational History  . Not on file  Tobacco Use  . Smoking status: Never Smoker  . Smokeless tobacco: Never Used  Substance and Sexual Activity  . Alcohol use: Yes    Comment: rarely  . Drug use: Not Currently  . Sexual activity: Not on file  Other Topics Concern  . Not on file  Social History Narrative  .  Not on file   Social Determinants of Health   Financial Resource Strain:   . Difficulty of Paying Living Expenses:   Food Insecurity:   . Worried About Charity fundraiser in the Last Year:   . Arboriculturist in the Last Year:   Transportation Needs:   . Film/video editor (Medical):   Marland Kitchen Lack of Transportation (Non-Medical):   Physical Activity:   . Days of Exercise per Week:   . Minutes of Exercise per Session:   Stress:   . Feeling of Stress :   Social Connections:   . Frequency of Communication with Friends and Family:   . Frequency of Social Gatherings with Friends and Family:   . Attends Religious Services:   . Active Member of Clubs or Organizations:   .  Attends Archivist Meetings:   Marland Kitchen Marital Status:      Family History: The patient's family history includes Diabetes in his father and paternal grandfather; Heart disease in his mother; Leukemia in his paternal grandfather.  ROS:   Review of Systems  Constitution: Negative for decreased appetite, fever and weight gain.  HENT: Negative for congestion, ear discharge, hoarse voice and sore throat.   Eyes: Negative for discharge, redness, vision loss in right eye and visual halos.  Cardiovascular: Negative for chest pain, dyspnea on exertion, leg swelling, orthopnea and palpitations.  Respiratory: Negative for cough, hemoptysis, shortness of breath and snoring.   Endocrine: Negative for heat intolerance and polyphagia.  Hematologic/Lymphatic: Negative for bleeding problem. Does not bruise/bleed easily.  Skin: Negative for flushing, nail changes, rash and suspicious lesions.  Musculoskeletal: Negative for arthritis, joint pain, muscle cramps, myalgias, neck pain and stiffness.  Gastrointestinal: Negative for abdominal pain, bowel incontinence, diarrhea and excessive appetite.  Genitourinary: Negative for decreased libido, genital sores and incomplete emptying.  Neurological: Negative for brief paralysis, focal weakness, headaches and loss of balance.  Psychiatric/Behavioral: Negative for altered mental status, depression and suicidal ideas.  Allergic/Immunologic: Negative for HIV exposure and persistent infections.    EKGs/Labs/Other Studies Reviewed:    The following studies were reviewed today:   EKG:  The ekg ordered today demonstrates sinus rhythm, heart rate 88 bpm with underlying right bundle branch block similar to prior EKG.  Recent Labs: 05/25/2020: BUN 11; Creatinine, Ser 1.09; Hemoglobin 15.0; Platelets 211; Potassium 4.0; Sodium 140  Recent Lipid Panel No results found for: CHOL, TRIG, HDL, CHOLHDL, VLDL, LDLCALC, LDLDIRECT  Physical Exam:    VS:  BP 104/76 (BP  Location: Left Arm, Patient Position: Sitting, Cuff Size: Large)   Pulse 88   Ht 5' 10"  (1.778 m)   Wt 291 lb (132 kg)   SpO2 96%   BMI 41.75 kg/m     Wt Readings from Last 3 Encounters:  05/27/20 291 lb (132 kg)  05/26/20 290 lb (131.5 kg)  05/25/20 290 lb (131.5 kg)     GEN: Well nourished, well developed in no acute distress HEENT: Normal NECK: No JVD; No carotid bruits LYMPHATICS: No lymphadenopathy CARDIAC: S1S2 noted,RRR, no murmurs, rubs, gallops RESPIRATORY:  Clear to auscultation without rales, wheezing or rhonchi  ABDOMEN: Soft, non-tender, non-distended, +bowel sounds, no guarding. EXTREMITIES: No edema, No cyanosis, no clubbing MUSCULOSKELETAL:  No deformity  SKIN: Warm and dry NEUROLOGIC:  Alert and oriented x 3, non-focal PSYCHIATRIC:  Normal affect, good insight  ASSESSMENT:    1. Precordial pain   2. Bilateral leg edema   3. Dizziness   4. Hyperlipidemia,  unspecified hyperlipidemia type   5. Morbid obesity ([place])   6. Right bundle branch block    PLAN:    His chest pain is concerning given the multiple symptoms. He does have some risk factors low to intermediate therefore at this time like to proceed with a coronary CTA in this patient. He has no IV dye contrast allergy .Educated patient about this testing. He is willing to proceed.  In terms of his dizziness/lightheadedness I would like to rule out a cardiovascular etiology of this palpitation, therefore at this time I would like to placed a zio patch for   14 days. In additon a transthoracic echocardiogram will be ordered to assess LV/RV function and any structural abnormalities. Once these testing have been performed amd reviewed further reccomendations will be made. For now, I do reccomend that the patient goes to the nearest ED if  symptoms recur.  His physical exam show evidence of bilateral +1 leg edema an echocardiogram will be performed as stated above.  The patient understands the need to lose  weight with diet and exercise. We have discussed specific strategies for this.  He will need to get a lipid profile which I have advised the patient come on a day fasting to get this testing done.  The patient is in agreement with the above plan. The patient left the office in stable condition.  The patient will follow up in 3 months or sooner if needed.   Medication Adjustments/Labs and Tests Ordered: Current medicines are reviewed at length with the patient today.  Concerns regarding medicines are outlined above.  Orders Placed This Encounter  Procedures  . CT CORONARY MORPH W/CTA COR W/SCORE W/CA W/CM &/OR WO/CM  . CT CORONARY FRACTIONAL FLOW RESERVE DATA PREP  . CT CORONARY FRACTIONAL FLOW RESERVE FLUID ANALYSIS  . Lipid panel  . Basic metabolic panel  . LONG TERM MONITOR (3-14 DAYS)  . EKG 12-Lead  . ECHOCARDIOGRAM COMPLETE   Meds ordered this encounter  Medications  . nitroGLYCERIN (NITROSTAT) 0.4 MG SL tablet    Sig: Place 1 tablet (0.4 mg total) under the tongue every 5 (five) minutes as needed.    Dispense:  25 tablet    Refill:  6  . metoprolol tartrate (LOPRESSOR) 100 MG tablet    Sig: Take 1 tablet (100 mg total) by mouth once for 1 dose. Take 2 hours prior to your CT if your heart rate is greater than 55    Dispense:  1 tablet    Refill:  0    Patient Instructions  Medication Instructions:  Your physician has recommended you make the following change in your medication:   Take Nitroglycerin as needed for chest pain.  *If you need a refill on your cardiac medications before your next appointment, please call your pharmacy*   Lab Work: Your physician recommends that you return for lab work in: 1 week prior to your CT you will need a BMET. You also need a lipid profile when fasting.  You need to have labs done when you are fasting.  You can come Monday through Friday 8:30 am to 12:00 pm and 1:15 to 4:30. You do not need to make an appointment as the order has  already been placed.   If you have labs (blood work) drawn today and your tests are completely normal, you will receive your results only by: Marland Kitchen MyChart Message (if you have MyChart) OR . A paper copy in the mail If you have any  lab test that is abnormal or we need to change your treatment, we will call you to review the results.   Testing/Procedures: Your physician has requested that you have an echocardiogram. Echocardiography is a painless test that uses sound waves to create images of your heart. It provides your doctor with information about the size and shape of your heart and how well your heart's chambers and valves are working. This procedure takes approximately one hour. There are no restrictions for this procedure.   WHY IS MY DOCTOR PRESCRIBING ZIO? The Zio system is proven and trusted by physicians to detect and diagnose irregular heart rhythms -- and has been prescribed to hundreds of thousands of patients.  The FDA has cleared the Zio system to monitor for many different kinds of irregular heart rhythms. In a study, physicians were able to reach a diagnosis 90% of the time with the Zio system1.  You can wear the Zio monitor -- a small, discreet, comfortable patch -- during your normal day-to-day activity, including while you sleep, shower, and exercise, while it records every single heartbeat for analysis.  1Barrett, P., et al. Comparison of 24 Hour Holter Monitoring Versus 14 Day Novel Adhesive Patch Electrocardiographic Monitoring. [place], 2014.  ZIO VS. HOLTER MONITORING The Zio monitor can be comfortably worn for up to 14 days. Holter monitors can be worn for 24 to 48 hours, limiting the time to record any irregular heart rhythms you may have. Zio is able to capture data for the 51% of patients who have their first symptom-triggered arrhythmia after 48 hours.1  LIVE WITHOUT RESTRICTIONS The Zio ambulatory cardiac monitor is a small, unobtrusive, and  water-resistant patch--you might even forget you're wearing it. The Zio monitor records and stores every beat of your heart, whether you're sleeping, working out, or showering.  Wear the monitor for 2 weeks, remove 06/10/20.    Your cardiac CT will be scheduled at:   [facility] [place], [place] [address] [phone]   If scheduled at [facility], please arrive at the [facility] main entrance of [facility] 30 minutes prior to test start time. Proceed to the [facility] Radiology Department (first floor) to check-in and test prep.  Please follow these instructions carefully (unless otherwise directed):  Hold all erectile dysfunction medications at least 3 days (72 hrs) prior to test.  On the Night Before the Test: . Be sure to Drink plenty of water. . Do not consume any caffeinated/decaffeinated beverages or chocolate 12 hours prior to your test. . Do not take any antihistamines 12 hours prior to your test.  On the Day of the Test: . Drink plenty of water. Do not drink any water within one hour of the test. . Do not eat any food 4 hours prior to the test. . You may take your regular medications prior to the test.  . Take metoprolol (Lopressor) two hours prior to test.    After the Test: . Drink plenty of water. . After receiving IV contrast, you may experience a mild flushed feeling. This is normal. . On occasion, you may experience a mild rash up to 24 hours after the test. This is not dangerous. If this occurs, you can take Benadryl 25 mg and increase your fluid intake. . If you experience trouble breathing, this can be serious. If it is severe call 911 IMMEDIATELY. If it is mild, please call our office.  Once we have confirmed authorization  from your insurance company, we will call you to set up a date and time for your test. Based on how quickly your insurance processes prior authorizations requests, please allow up to 4 weeks to be  contacted for scheduling your Cardiac CT appointment. Be advised that routine Cardiac CT appointments could be scheduled as many as 8 weeks after your provider has ordered it.  For non-scheduling related questions, please contact the cardiac imaging nurse navigator should you have any questions/concerns: Marchia Bond, Cardiac Imaging Nurse Navigator Burley Saver, Interim Cardiac Imaging Nurse [place] [facility] Direct Office Dial: [phone]   For scheduling needs, including cancellations and rescheduling, please call Vivien Rota at [phone].     Follow-Up: At [facility], you and your health needs are our priority.  As part of our continuing mission to provide you with exceptional heart care, we have created designated Provider Care Teams.  These Care Teams include your primary Cardiologist (physician) and Advanced Practice Providers (APPs -  Physician Assistants and Nurse Practitioners) who all work together to provide you with the care you need, when you need it.  We recommend signing up for the patient portal called "MyChart".  Sign up information is provided on this After Visit Summary.  MyChart is used to connect with patients for Virtual Visits (Telemedicine).  Patients are able to view lab/test results, encounter notes, upcoming appointments, etc.  Non-urgent messages can be sent to your provider as well.   To learn more about what you can do with MyChart, go to [email or web address].    Your next appointment:   3 month(s)  The format for your next appointment:   In Person  Provider:   Berniece Salines, DO   Other Instructions Cardiac CT Angiogram A cardiac CT angiogram is a procedure to look at the heart and the area around the heart. It may be done to help find the cause of chest pains or other symptoms of heart disease. During this procedure, a substance called contrast dye is injected into the blood vessels in the area to be checked. A large X-ray  machine, called a CT scanner, then takes detailed pictures of the heart and the surrounding area. The procedure is also sometimes called a coronary CT angiogram, coronary artery scanning, or CTA. A cardiac CT angiogram allows the health care provider to see how well blood is flowing to and from the heart. The health care provider will be able to see if there are any problems, such as:  Blockage or narrowing of the coronary arteries in the heart.  Fluid around the heart.  Signs of weakness or disease in the muscles, valves, and tissues of the heart. Tell a health care provider about:  Any allergies you have. This is especially important if you have had a previous allergic reaction to contrast dye.  All medicines you are taking, including vitamins, herbs, eye drops, creams, and over-the-counter medicines.  Any blood disorders you have.  Any surgeries you have had.  Any medical conditions you have.  Whether you are pregnant or may be pregnant.  Any anxiety disorders, chronic pain, or other conditions you have that may increase your stress or prevent you from lying still. What are the risks? Generally, this is a safe procedure. However, problems may occur, including: 1. Bleeding. 2. Infection. 3. Allergic reactions to medicines or dyes. 4. Damage to other structures or organs. 5. Kidney damage from the contrast dye that is used. 6. Increased risk of cancer from radiation  exposure. This risk is low. Talk with your health care provider about: ? The risks and benefits of testing. ? How you can receive the lowest dose of radiation. What happens before the procedure? 1. Wear comfortable clothing and remove any jewelry, glasses, dentures, and hearing aids. 2. Follow instructions from your health care provider about eating and drinking. This may include: ? For 12 hours before the procedure -- avoid caffeine. This includes tea, coffee, soda, energy drinks, and diet pills. Drink plenty of  water or other fluids that do not have caffeine in them. Being well hydrated can prevent complications. ? For 4-6 hours before the procedure -- stop eating and drinking. The contrast dye can cause nausea, but this is less likely if your stomach is empty. 3. Ask your health care provider about changing or stopping your regular medicines. This is especially important if you are taking diabetes medicines, blood thinners, or medicines to treat problems with erections (erectile dysfunction). What happens during the procedure?  1. Hair on your chest may need to be removed so that small sticky patches called electrodes can be placed on your chest. These will transmit information that helps to monitor your heart during the procedure. 2. An IV will be inserted into one of your veins. 3. You might be given a medicine to control your heart rate during the procedure. This will help to ensure that good images are obtained. 4. You will be asked to lie on an exam table. This table will slide in and out of the CT machine during the procedure. 5. Contrast dye will be injected into the IV. You might feel warm, or you may get a metallic taste in your mouth. 6. You will be given a medicine called nitroglycerin. This will relax or dilate the arteries in your heart. 7. The table that you are lying on will move into the CT machine tunnel for the scan. 8. The person running the machine will give you instructions while the scans are being done. You may be asked to: ? Keep your arms above your head. ? Hold your breath. ? Stay very still, even if the table is moving. 9. When the scanning is complete, you will be moved out of the machine. 10. The IV will be removed. The procedure may vary among health care providers and hospitals. What can I expect after the procedure? After your procedure, it is common to have:  A metallic taste in your mouth from the contrast dye.  A feeling of warmth.  A headache from the  nitroglycerin. Follow these instructions at home:  Take over-the-counter and prescription medicines only as told by your health care provider.  If you are told, drink enough fluid to keep your urine pale yellow. This will help to flush the contrast dye out of your body.  Most people can return to their normal activities right after the procedure. Ask your health care provider what activities are safe for you.  It is up to you to get the results of your procedure. Ask your health care provider, or the department that is doing the procedure, when your results will be ready.  Keep all follow-up visits as told by your health care provider. This is important. Contact a health care provider if: 1. You have any symptoms of allergy to the contrast dye. These include: ? Shortness of breath. ? Rash or hives. ? A racing heartbeat. Summary  A cardiac CT angiogram is a procedure to look at the heart and  the area around the heart. It may be done to help find the cause of chest pains or other symptoms of heart disease.  During this procedure, a large X-ray machine, called a CT scanner, takes detailed pictures of the heart and the surrounding area after a contrast dye has been injected into blood vessels in the area.  Ask your health care provider about changing or stopping your regular medicines before the procedure. This is especially important if you are taking diabetes medicines, blood thinners, or medicines to treat erectile dysfunction.  If you are told, drink enough fluid to keep your urine pale yellow. This will help to flush the contrast dye out of your body. This information is not intended to replace advice given to you by your health care provider. Make sure you discuss any questions you have with your health care provider. Document Revised: 05/22/2019 Document Reviewed: 05/22/2019 Elsevier Patient Education  [place].  Nitroglycerin sublingual tablets What is this  medicine? NITROGLYCERIN (nye troe GLI ser in) is a type of vasodilator. It relaxes blood vessels, increasing the blood and oxygen supply to your heart. This medicine is used to relieve chest pain caused by angina. It is also used to prevent chest pain before activities like climbing stairs, going outdoors in cold weather, or sexual activity. This medicine may be used for other purposes; ask your health care provider or pharmacist if you have questions. COMMON BRAND NAME(S): Nitroquick, Nitrostat, Nitrotab What should I tell my health care provider before I take this medicine? They need to know if you have any of these conditions:  anemia  head injury, recent stroke, or bleeding in the brain  liver disease  previous heart attack  an unusual or allergic reaction to nitroglycerin, other medicines, foods, dyes, or preservatives  pregnant or trying to get pregnant  breast-feeding How should I use this medicine? Take this medicine by mouth as needed. At the first sign of an angina attack (chest pain or tightness) place one tablet under your tongue. You can also take this medicine 5 to 10 minutes before an event likely to produce chest pain. Follow the directions on the prescription label. Let the tablet dissolve under the tongue. Do not swallow whole. Replace the dose if you accidentally swallow it. It will help if your mouth is not dry. Saliva around the tablet will help it to dissolve more quickly. Do not eat or drink, smoke or chew tobacco while a tablet is dissolving. If you are not better within 5 minutes after taking ONE dose of nitroglycerin, call 9-1-1 immediately to seek emergency medical care. Do not take more than 3 nitroglycerin tablets over 15 minutes. If you take this medicine often to relieve symptoms of angina, your doctor or health care professional may provide you with different instructions to manage your symptoms. If symptoms do not go away after following these instructions, it  is important to call 9-1-1 immediately. Do not take more than 3 nitroglycerin tablets over 15 minutes. Talk to your pediatrician regarding the use of this medicine in children. Special care may be needed. Overdosage: If you think you have taken too much of this medicine contact a poison control center or emergency room at once. NOTE: This medicine is only for you. Do not share this medicine with others. What if I miss a dose? This does not apply. This medicine is only used as needed. What may interact with this medicine? Do not take this medicine with any of the following  medications:  certain migraine medicines like ergotamine and dihydroergotamine (DHE)  medicines used to treat erectile dysfunction like sildenafil, tadalafil, and vardenafil  riociguat This medicine may also interact with the following medications:  alteplase  aspirin  heparin  medicines for high blood pressure  medicines for mental depression  other medicines used to treat angina  phenothiazines like chlorpromazine, mesoridazine, prochlorperazine, thioridazine This list may not describe all possible interactions. Give your health care provider a list of all the medicines, herbs, non-prescription drugs, or dietary supplements you use. Also tell them if you smoke, drink alcohol, or use illegal drugs. Some items may interact with your medicine. What should I watch for while using this medicine? Tell your doctor or health care professional if you feel your medicine is no longer working. Keep this medicine with you at all times. Sit or lie down when you take your medicine to prevent falling if you feel dizzy or faint after using it. Try to remain calm. This will help you to feel better faster. If you feel dizzy, take several deep breaths and lie down with your feet propped up, or bend forward with your head resting between your knees. You may get drowsy or dizzy. Do not drive, use machinery, or do anything that needs  mental alertness until you know how this drug affects you. Do not stand or sit up quickly, especially if you are an older patient. This reduces the risk of dizzy or fainting spells. Alcohol can make you more drowsy and dizzy. Avoid alcoholic drinks. Do not treat yourself for coughs, colds, or pain while you are taking this medicine without asking your doctor or health care professional for advice. Some ingredients may increase your blood pressure. What side effects may I notice from receiving this medicine? Side effects that you should report to your doctor or health care professional as soon as possible:  blurred vision  dry mouth  skin rash  sweating  the feeling of extreme pressure in the head  unusually weak or tired Side effects that usually do not require medical attention (report to your doctor or health care professional if they continue or are bothersome):  flushing of the face or neck  headache  irregular heartbeat, palpitations  nausea, vomiting This list may not describe all possible side effects. Call your doctor for medical advice about side effects. You may report side effects to FDA at 1-800-FDA-1088. Where should I keep my medicine? Keep out of the reach of children. Store at room temperature between 20 and 25 degrees C (68 and 77 degrees F). Store in [facility] [employer]. Protect from light and moisture. Keep tightly closed. Throw away any unused medicine after the expiration date. NOTE: This sheet is a summary. It may not cover all possible information. If you have questions about this medicine, talk to your doctor, pharmacist, or health care provider.  2020 Elsevier/Gold Standard (2013-07-25 17:57:36)  Echocardiogram An echocardiogram is a procedure that uses painless sound waves (ultrasound) to produce an image of the heart. Images from an echocardiogram can provide important information about:  Signs of coronary artery disease (CAD).  Aneurysm detection. An  aneurysm is a weak or damaged part of an artery wall that bulges out from the normal force of blood pumping through the body.  Heart size and shape. Changes in the size or shape of the heart can be associated with certain conditions, including heart failure, aneurysm, and CAD.  Heart muscle function.  Heart valve function.  Signs of a  past heart attack.  Fluid buildup around the heart.  Thickening of the heart muscle.  A tumor or infectious growth around the heart valves. Tell a health care provider about:  Any allergies you have.  All medicines you are taking, including vitamins, herbs, eye drops, creams, and over-the-counter medicines.  Any blood disorders you have.  Any surgeries you have had.  Any medical conditions you have.  Whether you are pregnant or may be pregnant. What are the risks? Generally, this is a safe procedure. However, problems may occur, including:  Allergic reaction to dye (contrast) that may be used during the procedure. What happens before the procedure? No specific preparation is needed. You may eat and drink normally. What happens during the procedure?   An IV tube may be inserted into one of your veins.  You may receive contrast through this tube. A contrast is an injection that improves the quality of the pictures from your heart.  A gel will be applied to your chest.  A wand-like tool (transducer) will be moved over your chest. The gel will help to transmit the sound waves from the transducer.  The sound waves will harmlessly bounce off of your heart to allow the heart images to be captured in real-time motion. The images will be recorded on a computer. The procedure may vary among health care providers and hospitals. What happens after the procedure?  You may return to your normal, everyday life, including diet, activities, and medicines, unless your health care provider tells you not to do that. Summary  An echocardiogram is a  procedure that uses painless sound waves (ultrasound) to produce an image of the heart.  Images from an echocardiogram can provide important information about the size and shape of your heart, heart muscle function, heart valve function, and fluid buildup around your heart.  You do not need to do anything to prepare before this procedure. You may eat and drink normally.  After the echocardiogram is completed, you may return to your normal, everyday life, unless your health care provider tells you not to do that. This information is not intended to replace advice given to you by your health care provider. Make sure you discuss any questions you have with your health care provider. Document Revised: 01/17/2019 Document Reviewed: 10/29/2016 Elsevier Patient Education  [place].       Adopting a Healthy Lifestyle.  Know what a healthy weight is for you (roughly BMI <25) and aim to maintain this   Aim for 7+ servings of fruits and vegetables daily   65-80+ fluid ounces of water or unsweet tea for healthy kidneys   Limit to max 1 drink of alcohol per day; avoid smoking/tobacco   Limit animal fats in diet for cholesterol and heart health - choose grass fed whenever available   Avoid highly processed foods, and foods high in saturated/trans fats   Aim for low stress - take time to unwind and care for your mental health   Aim for 150 min of moderate intensity exercise weekly for heart health, and weights twice weekly for bone health   Aim for 7-9 hours of sleep daily   When it comes to diets, agreement about the perfect plan isnt easy to find, even among the experts. Experts at the [employer] developed an idea known as the Healthy Eating Plate. Just imagine a plate divided into logical, healthy portions.   The emphasis is on diet quality:   Load up  on vegetables and fruits - one-half of your plate: Aim for color and variety, and remember that potatoes  dont count.   Go for whole grains - one-quarter of your plate: Whole wheat, barley, wheat berries, quinoa, oats, brown rice, and foods made with them. If you want pasta, go with whole wheat pasta.   Protein power - one-quarter of your plate: Fish, chicken, beans, and nuts are all healthy, versatile protein sources. Limit red meat.   The diet, however, does go beyond the plate, offering a few other suggestions.   Use healthy plant oils, such as olive, canola, soy, corn, sunflower and peanut. Check the labels, and avoid partially hydrogenated oil, which have unhealthy trans fats.   If youre thirsty, drink water. Coffee and tea are good in moderation, but skip sugary drinks and limit milk and dairy products to one or two daily servings.   The type of carbohydrate in the diet is more important than the amount. Some sources of carbohydrates, such as vegetables, fruits, whole grains, and beans-are healthier than others.   Finally, stay active  Signed, Berniece Salines, DO  05/27/2020 4:03 PM    [facility]

## 2020-06-17 ENCOUNTER — Telehealth: Payer: Self-pay | Admitting: Cardiology

## 2020-06-17 NOTE — Telephone Encounter (Signed)
He should continue to exercise if he can tolerate this, and eat a heart healthy diet.

## 2020-06-17 NOTE — Telephone Encounter (Signed)
Patient has tested positive recently for covid. He wants to know if there is anything heart related he should be doing to make sure he stays in good health. Please advise.

## 2020-06-17 NOTE — Telephone Encounter (Signed)
Called patient informed him of Dr. Mallory Shirk message below. He verbally understood. No further questions.

## 2020-06-23 ENCOUNTER — Telehealth: Payer: Self-pay

## 2020-06-23 ENCOUNTER — Ambulatory Visit: Payer: BC Managed Care – PPO | Admitting: Family Medicine

## 2020-06-23 LAB — BASIC METABOLIC PANEL
BUN/Creatinine Ratio: 12 (ref 9–20)
BUN: 12 mg/dL (ref 6–24)
CO2: 23 mmol/L (ref 20–29)
Calcium: 9.1 mg/dL (ref 8.7–10.2)
Chloride: 108 mmol/L — ABNORMAL HIGH (ref 96–106)
Creatinine, Ser: 1.03 mg/dL (ref 0.76–1.27)
GFR calc Af Amer: 104 mL/min/{1.73_m2} (ref >59)
GFR calc non Af Amer: 90 mL/min/{1.73_m2} (ref >59)
Glucose: 99 mg/dL (ref 65–99)
Potassium: 4.5 mmol/L (ref 3.5–5.2)
Sodium: 145 mmol/L — ABNORMAL HIGH (ref 134–144)

## 2020-06-23 LAB — LIPID PANEL
Chol/HDL Ratio: 5.8 ratio — ABNORMAL HIGH (ref 0.0–5.0)
Cholesterol, Total: 197 mg/dL (ref 100–199)
HDL: 34 mg/dL — ABNORMAL LOW (ref >39)
LDL Chol Calc (NIH): 121 mg/dL — ABNORMAL HIGH (ref 0–99)
Triglycerides: 238 mg/dL — ABNORMAL HIGH (ref 0–149)
VLDL Cholesterol Cal: 42 mg/dL — ABNORMAL HIGH (ref 5–40)

## 2020-06-23 MED ORDER — ROSUVASTATIN CALCIUM 5 MG PO TABS: 5.0000 mg | ORAL_TABLET | Freq: Every day | ORAL | 3 refills | Status: DC

## 2020-06-23 NOTE — Telephone Encounter (Signed)
Spoke with patient regarding results and recommendation.  Patient verbalizes understanding and is agreeable to plan of care. Advised patient to call back with any issues or concerns.  

## 2020-06-23 NOTE — Telephone Encounter (Signed)
-----   Message from Thomasene Ripple, DO sent at 06/23/2020 10:51 AM EDT -----  There is evidence of dyslipidemia:  HDL is lower than normal, LDL is elevated and  triglycerides are also elevated. I will like to start you on low-dose statin Crestor 5 mg daily

## 2020-06-23 NOTE — Telephone Encounter (Signed)
-----   Message from Kardie Tobb, DO sent at 06/23/2020 10:51 AM EDT -----  There is evidence of dyslipidemia:  HDL is lower than normal, LDL is elevated and  triglycerides are also elevated. I will like to start you on low-dose statin Crestor 5 mg daily 

## 2020-06-23 NOTE — Telephone Encounter (Signed)
Left message on patients voicemail to please return our call.   

## 2020-06-24 ENCOUNTER — Ambulatory Visit (HOSPITAL_BASED_OUTPATIENT_CLINIC_OR_DEPARTMENT_OTHER)
Admission: RE | Admit: 2020-06-24 | Discharge: 2020-06-24 | Disposition: A | Discharge status: Home / Self Care | Payer: BC Managed Care – PPO | Source: Ambulatory Visit | Attending: Cardiology | Admitting: Cardiology

## 2020-06-24 ENCOUNTER — Other Ambulatory Visit: Payer: Self-pay

## 2020-06-24 DIAGNOSIS — R072 Precordial pain: Secondary | ICD-10-CM

## 2020-06-24 DIAGNOSIS — R600 Localized edema: Secondary | ICD-10-CM | POA: Diagnosis not present

## 2020-06-24 LAB — ECHOCARDIOGRAM COMPLETE
Area-P 1/2: 3.85 cm2
S' Lateral: 3.4 cm

## 2020-06-25 ENCOUNTER — Telehealth: Payer: Self-pay

## 2020-06-25 NOTE — Telephone Encounter (Signed)
Left message on patients voicemail to please return our call.   

## 2020-06-25 NOTE — Telephone Encounter (Signed)
-----   Message from Thomasene Ripple, DO sent at 06/24/2020 10:06 PM EDT ----- Randie Heinz news, echo normal

## 2020-07-08 ENCOUNTER — Telehealth: Payer: Self-pay

## 2020-07-08 NOTE — Telephone Encounter (Signed)
-----   Message from Kardie Tobb, DO sent at 07/03/2020  5:11 PM EDT ----- Unremarkable study with no significant arrhythmia. 

## 2020-07-08 NOTE — Telephone Encounter (Signed)
Left message on patients voicemail to please return our call.   

## 2020-07-09 ENCOUNTER — Telehealth: Payer: Self-pay

## 2020-07-09 NOTE — Telephone Encounter (Signed)
-----   Message from Thomasene Ripple, DO sent at 07/03/2020  5:11 PM EDT ----- Unremarkable study with no significant arrhythmia.

## 2020-07-09 NOTE — Telephone Encounter (Signed)
Left message on patients voicemail to please return our call.   

## 2020-07-10 ENCOUNTER — Telehealth: Payer: Self-pay

## 2020-07-10 NOTE — Telephone Encounter (Signed)
Left message on patients voicemail to please return our call.   

## 2020-07-10 NOTE — Telephone Encounter (Signed)
-----   Message from Thomasene Ripple, DO sent at 07/03/2020  5:11 PM EDT ----- Unremarkable study with no significant arrhythmia.

## 2020-07-22 ENCOUNTER — Other Ambulatory Visit: Payer: Self-pay

## 2020-07-22 ENCOUNTER — Encounter: Payer: Self-pay | Admitting: Family Medicine

## 2020-07-22 ENCOUNTER — Ambulatory Visit (INDEPENDENT_AMBULATORY_CARE_PROVIDER_SITE_OTHER): Payer: BC Managed Care – PPO | Admitting: Family Medicine

## 2020-07-22 DIAGNOSIS — E785 Hyperlipidemia, unspecified: Secondary | ICD-10-CM | POA: Diagnosis not present

## 2020-07-22 DIAGNOSIS — R079 Chest pain, unspecified: Secondary | ICD-10-CM | POA: Diagnosis not present

## 2020-07-22 DIAGNOSIS — R7309 Other abnormal glucose: Secondary | ICD-10-CM | POA: Diagnosis not present

## 2020-07-22 DIAGNOSIS — E039 Hypothyroidism, unspecified: Secondary | ICD-10-CM | POA: Diagnosis not present

## 2020-07-22 DIAGNOSIS — K219 Gastro-esophageal reflux disease without esophagitis: Secondary | ICD-10-CM

## 2020-07-22 LAB — HEMOGLOBIN A1C: Hgb A1c MFr Bld: 5.7 % (ref 4.6–6.5)

## 2020-07-22 LAB — AMYLASE: Amylase: 36 U/L (ref 27–131)

## 2020-07-22 LAB — TSH: TSH: 1.83 u[IU]/mL (ref 0.35–4.50)

## 2020-07-22 MED ORDER — OMEPRAZOLE 20 MG PO CPDR: 20.0000 mg | DELAYED_RELEASE_CAPSULE | Freq: Every day | ORAL | 1 refills | Status: DC

## 2020-07-22 NOTE — Patient Instructions (Signed)
Dyslipidemia Dyslipidemia is an imbalance of waxy, fat-like substances (lipids) in the blood. The body needs lipids in small amounts. Dyslipidemia often involves a high level of cholesterol or triglycerides, which are types of lipids. Common forms of dyslipidemia include:  High levels of LDL cholesterol. LDL is the type of cholesterol that causes fatty deposits (plaques) to build up in the blood vessels that carry blood away from your heart (arteries).  Low levels of HDL cholesterol. HDL cholesterol is the type of cholesterol that protects against heart disease. High levels of HDL remove the LDL buildup from arteries.  High levels of triglycerides. Triglycerides are a fatty substance in the blood that is linked to a buildup of plaques in the arteries. What are the causes? Primary dyslipidemia is caused by changes (mutations) in genes that are passed down through families (inherited). These mutations cause several types of dyslipidemia. Secondary dyslipidemia is caused by lifestyle choices and diseases that lead to dyslipidemia, such as:  Eating a diet that is high in animal fat.  Not getting enough exercise.  Having diabetes, kidney disease, liver disease, or thyroid disease.  Drinking large amounts of alcohol.  Using certain medicines. What increases the risk? You are more likely to develop this condition if you are an older man or if you are a woman who has gone through menopause. Other risk factors include:  Having a family history of dyslipidemia.  Taking certain medicines, including birth control pills, steroids, some diuretics, and beta-blockers.  Smoking cigarettes.  Eating a high-fat diet.  Having certain medical conditions such as diabetes, polycystic ovary syndrome (PCOS), kidney disease, liver disease, or hypothyroidism.  Not exercising regularly.  Being overweight or obese with too much belly fat. What are the signs or symptoms? In most cases, dyslipidemia does not  usually cause any symptoms. In severe cases, very high lipid levels can cause:  Fatty bumps under the skin (xanthomas).  White or gray ring around the black center (pupil) of the eye. Very high triglyceride levels can cause inflammation of the pancreas (pancreatitis). How is this diagnosed? Your health care provider may diagnose dyslipidemia based on a routine blood test (fasting blood test). Because most people do not have symptoms of the condition, this blood testing (lipid profile) is done on adults age 81 and older and is repeated every 5 years. This test checks:  Total cholesterol. This measures the total amount of cholesterol in your blood, including LDL cholesterol, HDL cholesterol, and triglycerides. A healthy number is below 200.  LDL cholesterol. The target number for LDL cholesterol is different for each person, depending on individual risk factors. Ask your health care provider what your LDL cholesterol should be.  HDL cholesterol. An HDL level of 60 or higher is best because it helps to protect against heart disease. A number below 45 for men or below 64 for women increases the risk for heart disease.  Triglycerides. A healthy triglyceride number is below 150. If your lipid profile is abnormal, your health care provider may do other blood tests. How is this treated? Treatment depends on the type of dyslipidemia that you have and your other risk factors for heart disease and stroke. Your health care provider will have a target range for your lipid levels based on this information. For many people, this condition may be treated by lifestyle changes, such as diet and exercise. Your health care provider may recommend that you:  Get regular exercise.  Make changes to your diet.  Quit smoking if you  smoke. If diet changes and exercise do not help you reach your goals, your health care provider may also prescribe medicine to lower lipids. The most commonly prescribed type of medicine  lowers your LDL cholesterol (statin drug). If you have a high triglyceride level, your provider may prescribe another type of drug (fibrate) or an omega-3 fish oil supplement, or both. Follow these instructions at home:  Eating and drinking  Follow instructions from your health care provider or dietitian about eating or drinking restrictions.  Eat a healthy diet as told by your health care provider. This can help you reach and maintain a healthy weight, lower your LDL cholesterol, and raise your HDL cholesterol. This may include: ? Limiting your calories, if you are overweight. ? Eating more fruits, vegetables, whole grains, fish, and lean meats. ? Limiting saturated fat, trans fat, and cholesterol.  If you drink alcohol: ? Limit how much you use. ? Be aware of how much alcohol is in your drink. In the [place] one drink equals one 12 oz bottle of beer (355 mL), one 5 oz glass of wine (148 mL), or one 1 oz glass of hard liquor (44 mL).  Do not drink alcohol if: ? Your health care provider tells you not to drink. ? You are pregnant, may be pregnant, or are planning to become pregnant. Activity  Get regular exercise. Start an exercise and strength training program as told by your health care provider. Ask your health care provider what activities are safe for you. Your health care provider may recommend: ? 30 minutes of aerobic activity 4-6 days a week. Brisk walking is an example of aerobic activity. ? Strength training 2 days a week. General instructions  Do not use any products that contain nicotine or tobacco, such as cigarettes, e-cigarettes, and chewing tobacco. If you need help quitting, ask your health care provider.  Take over-the-counter and prescription medicines only as told by your health care provider. This includes supplements.  Keep all follow-up visits as told by your health care provider. Contact a health care provider if:  You are: ? Having trouble sticking to your  exercise or diet plan. ? Struggling to quit smoking or control your use of alcohol. Summary  Dyslipidemia often involves a high level of cholesterol or triglycerides, which are types of lipids.  Treatment depends on the type of dyslipidemia that you have and your other risk factors for heart disease and stroke.  For many people, treatment starts with lifestyle changes, such as diet and exercise.  Your health care provider may prescribe medicine to lower lipids. This information is not intended to replace advice given to you by your health care provider. Make sure you discuss any questions you have with your health care provider. Document Revised: 05/21/2018 Document Reviewed: 04/27/2018 Elsevier Patient Education  2020 [employer]  Hypothyroidism  Hypothyroidism is when the thyroid gland does not make enough of certain hormones (it is underactive). The thyroid gland is a small gland located in the lower front part of the neck, just in front of the windpipe (trachea). This gland makes hormones that help control how the body uses food for energy (metabolism) as well as how the heart and brain function. These hormones also play a role in keeping your bones strong. When the thyroid is underactive, it produces too little of the hormones thyroxine (T4) and triiodothyronine (T3). What are the causes? This condition may be caused by:  Hashimoto's disease. This is a disease in which the  body's disease-fighting system (immune system) attacks the thyroid gland. This is the most common cause.  Viral infections.  Pregnancy.  Certain medicines.  Birth defects.  Past radiation treatments to the head or neck for cancer.  Past treatment with radioactive iodine.  Past exposure to radiation in the environment.  Past surgical removal of part or all of the thyroid.  Problems with a gland in the center of the brain (pituitary gland).  Lack of enough iodine in the diet. What increases the  risk? You are more likely to develop this condition if:  You are male.  You have a family history of thyroid conditions.  You use a medicine called lithium.  You take medicines that affect the immune system (immunosuppressants). What are the signs or symptoms? Symptoms of this condition include:  Feeling as though you have no energy (lethargy).  Not being able to tolerate cold.  Weight gain that is not explained by a change in diet or exercise habits.  Lack of appetite.  Dry skin.  Coarse hair.  Menstrual irregularity.  Slowing of thought processes.  Constipation.  Sadness or depression. How is this diagnosed? This condition may be diagnosed based on:  Your symptoms, your medical history, and a physical exam.  Blood tests. You may also have imaging tests, such as an ultrasound or MRI. How is this treated? This condition is treated with medicine that replaces the thyroid hormones that your body does not make. After you begin treatment, it may take several weeks for symptoms to go away. Follow these instructions at home:  Take over-the-counter and prescription medicines only as told by your health care provider.  If you start taking any new medicines, tell your health care provider.  Keep all follow-up visits as told by your health care provider. This is important. ? As your condition improves, your dosage of thyroid hormone medicine may change. ? You will need to have blood tests regularly so that your health care provider can monitor your condition. Contact a health care provider if:  Your symptoms do not get better with treatment.  You are taking thyroid replacement medicine and you: ? Sweat a lot. ? Have tremors. ? Feel anxious. ? Lose weight rapidly. ? Cannot tolerate heat. ? Have emotional swings. ? Have diarrhea. ? Feel weak. Get help right away if you have:  Chest pain.  An irregular heartbeat.  A rapid heartbeat.  Difficulty  breathing. Summary  Hypothyroidism is when the thyroid gland does not make enough of certain hormones (it is underactive).  When the thyroid is underactive, it produces too little of the hormones thyroxine (T4) and triiodothyronine (T3).  The most common cause is Hashimoto's disease, a disease in which the body's disease-fighting system (immune system) attacks the thyroid gland. The condition can also be caused by viral infections, medicine, pregnancy, or past radiation treatment to the head or neck.  Symptoms may include weight gain, dry skin, constipation, feeling as though you do not have energy, and not being able to tolerate cold.  This condition is treated with medicine to replace the thyroid hormones that your body does not make. This information is not intended to replace advice given to you by your health care provider. Make sure you discuss any questions you have with your health care provider. Document Revised: 09/08/2017 Document Reviewed: 09/06/2017 Elsevier Patient Education  2020 [employer] Inc[employer]  Calorie Counting for [employer] Calories are units of energy. Your body needs a certain amount of calories from food  to keep you going throughout the day. When you eat more calories than your body needs, your body stores the extra calories as fat. When you eat fewer calories than your body needs, your body burns fat to get the energy it needs. Calorie counting means keeping track of how many calories you eat and drink each day. Calorie counting can be helpful if you need to lose weight. If you make sure to eat fewer calories than your body needs, you should lose weight. Ask your health care provider what a healthy weight is for you. For calorie counting to work, you will need to eat the right number of calories in a day in order to lose a healthy amount of weight per week. A dietitian can help you determine how many calories you need in a day and will give you suggestions on how to reach  your calorie goal.  A healthy amount of weight to lose per week is usually 1-2 lb (0.5-0.9 kg). This usually means that your daily calorie intake should be reduced by 500-750 calories.  Eating 1,200 - 1,500 calories per day can help most women lose weight.  Eating 1,500 - 1,800 calories per day can help most men lose weight. What is my plan? My goal is to have __________ calories per day. If I have this many calories per day, I should lose around __________ pounds per week. What do I need to know about calorie counting? In order to meet your daily calorie goal, you will need to:  Find out how many calories are in each food you would like to eat. Try to do this before you eat.  Decide how much of the food you plan to eat.  Write down what you ate and how many calories it had. Doing this is called keeping a food log. To successfully lose weight, it is important to balance calorie counting with a healthy lifestyle that includes regular activity. Aim for 150 minutes of moderate exercise (such as walking) or 75 minutes of vigorous exercise (such as running) each week. Where do I find calorie information?  The number of calories in a food can be found on a Nutrition Facts label. If a food does not have a Nutrition Facts label, try to look up the calories online or ask your dietitian for help. Remember that calories are listed per serving. If you choose to have more than one serving of a food, you will have to multiply the calories per serving by the amount of servings you plan to eat. For example, the label on a package of bread might say that a serving size is 1 slice and that there are 90 calories in a serving. If you eat 1 slice, you will have eaten 90 calories. If you eat 2 slices, you will have eaten 180 calories. How do I keep a food log? Immediately after each meal, record the following information in your food log:  What you ate. Don't forget to include toppings, sauces, and other extras  on the food.  How much you ate. This can be measured in cups, ounces, or number of items.  How many calories each food and drink had.  The total number of calories in the meal. Keep your food log near you, such as in a small notebook in your pocket, or use a mobile app or website. Some programs will calculate calories for you and show you how many calories you have left for the day to meet your goal.  What are some calorie counting tips?   Use your calories on foods and drinks that will fill you up and not leave you hungry: ? Some examples of foods that fill you up are nuts and nut butters, vegetables, lean proteins, and high-fiber foods like whole grains. High-fiber foods are foods with more than 5 g fiber per serving. ? Drinks such as sodas, specialty coffee drinks, alcohol, and juices have a lot of calories, yet do not fill you up.  Eat nutritious foods and avoid empty calories. Empty calories are calories you get from foods or beverages that do not have many vitamins or protein, such as candy, sweets, and soda. It is better to have a nutritious high-calorie food (such as an avocado) than a food with few nutrients (such as a bag of chips).  Know how many calories are in the foods you eat most often. This will help you calculate calorie counts faster.  Pay attention to calories in drinks. Low-calorie drinks include water and unsweetened drinks.  Pay attention to nutrition labels for "low fat" or "fat free" foods. These foods sometimes have the same amount of calories or more calories than the full fat versions. They also often have added sugar, starch, or salt, to make up for flavor that was removed with the fat.  Find a way of tracking calories that works for you. Get creative. Try different apps or programs if writing down calories does not work for you. What are some portion control tips?  Know how many calories are in a serving. This will help you know how many servings of a certain  food you can have.  Use a measuring cup to measure serving sizes. You could also try weighing out portions on a kitchen scale. With time, you will be able to estimate serving sizes for some foods.  Take some time to put servings of different foods on your favorite plates, bowls, and cups so you know what a serving looks like.  Try not to eat straight from a bag or box. Doing this can lead to overeating. Put the amount you would like to eat in a cup or on a plate to make sure you are eating the right portion.  Use smaller plates, glasses, and bowls to prevent overeating.  Try not to multitask (for example, watch TV or use your computer) while eating. If it is time to eat, sit down at a table and enjoy your food. This will help you to know when you are full. It will also help you to be aware of what you are eating and how much you are eating. What are tips for following this plan? Reading food labels  Check the calorie count compared to the serving size. The serving size may be smaller than what you are used to eating.  Check the source of the calories. Make sure the food you are eating is high in vitamins and protein and low in saturated and trans fats. Shopping  Read nutrition labels while you shop. This will help you make healthy decisions before you decide to purchase your food.  Make a grocery list and stick to it. Cooking  Try to cook your favorite foods in a healthier way. For example, try baking instead of frying.  Use low-fat dairy products. Meal planning  Use more fruits and vegetables. Half of your plate should be fruits and vegetables.  Include lean proteins like poultry and fish. How do I count calories when eating out?  Ask  for smaller portion sizes.  Consider sharing an entree and sides instead of getting your own entree.  If you get your own entree, eat only half. Ask for a box at the beginning of your meal and put the rest of your entree in it so you are not  tempted to eat it.  If calories are listed on the menu, choose the lower calorie options.  Choose dishes that include vegetables, fruits, whole grains, low-fat dairy products, and lean protein.  Choose items that are boiled, broiled, grilled, or steamed. Stay away from items that are buttered, battered, fried, or served with cream sauce. Items labeled "crispy" are usually fried, unless stated otherwise.  Choose water, low-fat milk, unsweetened iced tea, or other drinks without added sugar. If you want an alcoholic beverage, choose a lower calorie option such as a glass of wine or light beer.  Ask for dressings, sauces, and syrups on the side. These are usually high in calories, so you should limit the amount you eat.  If you want a salad, choose a garden salad and ask for grilled meats. Avoid extra toppings like bacon, cheese, or fried items. Ask for the dressing on the side, or ask for olive oil and vinegar or lemon to use as dressing.  Estimate how many servings of a food you are given. For example, a serving of cooked rice is  cup or about the size of half a baseball. Knowing serving sizes will help you be aware of how much food you are eating at restaurants. The list below tells you how big or small some common portion sizes are based on everyday objects: ? 1 oz--4 stacked dice. ? 3 oz--1 deck of cards. ? 1 tsp--1 die. ? 1 Tbsp-- a ping-pong ball. ? 2 Tbsp--1 ping-pong ball. ?  cup-- baseball. ? 1 cup--1 baseball. Summary  Calorie counting means keeping track of how many calories you eat and drink each day. If you eat fewer calories than your body needs, you should lose weight.  A healthy amount of weight to lose per week is usually 1-2 lb (0.5-0.9 kg). This usually means reducing your daily calorie intake by 500-750 calories.  The number of calories in a food can be found on a Nutrition Facts label. If a food does not have a Nutrition Facts label, try to look up the calories  online or ask your dietitian for help.  Use your calories on foods and drinks that will fill you up, and not on foods and drinks that will leave you hungry.  Use smaller plates, glasses, and bowls to prevent overeating. This information is not intended to replace advice given to you by your health care provider. Make sure you discuss any questions you have with your health care provider. Document Revised: 06/15/2018 Document Reviewed: 08/26/2016 Elsevier Patient Education  2020 [employer].

## 2020-07-22 NOTE — Progress Notes (Signed)
New Patient Office Visit  Subjective:  Patient ID: Carlos Hendricks, male    DOB: September 04, 1981  Age: 39 y.o. MRN: [id number]  CC:  Chief Complaint  Patient presents with  . Establish Care    New patient, states that he has mild pains in chest, arms and head that come and go. Not sure the reason for this.     HPI Carlos Hendricks presents for establishment of care and follow-up for some chest pains that he has been experiencing over the last 2 to 3 months.  Chest pains are random.  They occur at rest.  They do not seem to be associated with exertion.  There is no nausea shortness of breath or diaphoresis.  Status post Covid infection 1 month ago.  He has since recovered.  He is undergoing cardiac work-up for his chest pain.  Chart reviewed.  Normal echocardiogram.  Dye study has been proposed.  He was given nitroglycerin to try.  Chest pains are fleeting and only last for seconds.  He has had no abdominal pain.  He is moving his bowels normally.  No problems with urination.  He does have a random frontal headache with the chest pains.  He will sometimes have pains in his arms.  All of these pains are quite fleeting and only lasts for seconds.  There did not seem to be any provocative or alleviating factors.  Thinks that he may have been diagnosed with hypothyroidism in the past.  He took medications and then his thyroid functions normalized and he was taken off of the medication, he tells me.  Denies any type of stress in his life.  Past Medical History:  Diagnosis Date  . Allergic contact dermatitis 03/29/2018  . BACK PAIN 07/05/2007   Qualifier: Diagnosis of  By: Drue Novel MD, Nolon Rod   . Class 3 obesity with body mass index (BMI) of 40.0 to 44.9 in adult 05/16/2017  . Hyperlipidemia 05/16/2017  . HYPOGONADISM 07/05/2007   Qualifier: Diagnosis of  By: Tyrone Apple, Lucy    . Hypotestosteronism 07/05/2007  . HYPOTHYROIDISM 07/05/2007   Qualifier: Diagnosis of  By: Tyrone Apple, Lucy    . Perennial  allergic rhinitis with seasonal variation 09/04/2016    Past Surgical History:  Procedure Laterality Date  . POLYPECTOMY    . WISDOM TOOTH EXTRACTION      Family History  Problem Relation Age of Onset  . Heart disease Mother   . Diabetes Father   . Diabetes Paternal Grandfather   . Leukemia Paternal Grandfather     Social History   Socioeconomic History  . Marital status: Married    Spouse name: Not on file  . Number of children: Not on file  . Years of education: Not on file  . Highest education level: Not on file  Occupational History  . Not on file  Tobacco Use  . Smoking status: Never Smoker  . Smokeless tobacco: Never Used  Substance and Sexual Activity  . Alcohol use: Yes    Comment: rarely  . Drug use: Not Currently  . Sexual activity: Not on file  Other Topics Concern  . Not on file  Social History Narrative  . Not on file   Social Determinants of Health   Financial Resource Strain:   . Difficulty of Paying Living Expenses: Not on file  Food Insecurity:   . Worried About Programme researcher, broadcasting/film/video in the Last Year: Not on file  . Ran Out of Food  in the Last Year: Not on file  Transportation Needs:   . Lack of Transportation (Medical): Not on file  . Lack of Transportation (Non-Medical): Not on file  Physical Activity:   . Days of Exercise per Week: Not on file  . Minutes of Exercise per Session: Not on file  Stress:   . Feeling of Stress : Not on file  Social Connections:   . Frequency of Communication with Friends and Family: Not on file  . Frequency of Social Gatherings with Friends and Family: Not on file  . Attends Religious Services: Not on file  . Active Member of Clubs or Organizations: Not on file  . Attends Banker Meetings: Not on file  . Marital Status: Not on file  Intimate Partner Violence:   . Fear of Current or Ex-Partner: Not on file  . Emotionally Abused: Not on file  . Physically Abused: Not on file  . Sexually Abused:  Not on file    ROS Review of Systems  Objective:   Today's Vitals: BP 122/78   Pulse 83   Temp 97.6 F (36.4 C) (Tympanic)   Ht 5\' 10"  (1.778 m)   Wt 278 lb 9.6 oz (126.4 kg)   SpO2 96%   BMI 39.97 kg/m   Physical Exam  Assessment & Plan:   Problem List Items Addressed This Visit      Digestive   Gastroesophageal reflux disease   Relevant Medications   omeprazole (PRILOSEC) 20 MG capsule   Other Relevant Orders   Amylase     Endocrine   Hypothyroidism   Relevant Orders   TSH     Other   Dyslipidemia   Chest pain   Relevant Orders   Amylase   Morbid obesity (HCC) - Primary   Elevated glucose   Relevant Orders   Hemoglobin A1c      Outpatient Encounter Medications as of 07/22/2020  Medication Sig  . metoprolol tartrate (LOPRESSOR) 100 MG tablet Take 1 tablet (100 mg total) by mouth once for 1 dose. Take 2 hours prior to your CT if your heart rate is greater than 55  . nitroGLYCERIN (NITROSTAT) 0.4 MG SL tablet Place 1 tablet (0.4 mg total) under the tongue every 5 (five) minutes as needed. (Patient not taking: Reported on 07/22/2020)  . omeprazole (PRILOSEC) 20 MG capsule Take 1 capsule (20 mg total) by mouth daily.  . rosuvastatin (CRESTOR) 5 MG tablet Take 1 tablet (5 mg total) by mouth daily. (Patient not taking: Reported on 07/22/2020)   No facility-administered encounter medications on file as of 07/22/2020.    Follow-up: Return in about 3 months (around 10/22/2020), or trial of prilosec to see if it helps chest pains..  Was given information on calorie counting to lose weight.  He will make a concerted effort to do so.  Trial of Prilosec to see if it helps his chest pains.  He will follow back up with cardiology for dye test.  Advised Covid vaccination.  Suggested that he try nitroglycerin the next time he feels chest pains.  This could be useful diagnostically. 10/24/2020, MD

## 2020-07-27 ENCOUNTER — Telehealth: Payer: Self-pay | Admitting: Cardiology

## 2020-07-27 NOTE — Telephone Encounter (Signed)
Patient calling to schedule his CT Morph.

## 2020-08-03 ENCOUNTER — Telehealth (HOSPITAL_COMMUNITY): Payer: Self-pay | Admitting: Emergency Medicine

## 2020-08-03 NOTE — Telephone Encounter (Signed)
Reaching out to patient to offer assistance regarding upcoming cardiac imaging study; pt verbalizes understanding of appt date/time, parking situation and where to check in, pre-test NPO status and medications ordered, and verified current allergies; name and call back number provided for further questions should they arise Rockwell Alexandria RN Navigator Cardiac Imaging [facility] [phone] office [phone] cell   Pt instructed to take metoprolol tartrate 2 hr prior to scan. Will avoid use of ED medications  Pt asked how much test was going to cost, gave him number to financial/billing dept. [place] Dec

## 2020-08-04 ENCOUNTER — Encounter (HOSPITAL_COMMUNITY): Payer: Self-pay

## 2020-08-04 ENCOUNTER — Ambulatory Visit (HOSPITAL_COMMUNITY): Payer: BC Managed Care – PPO

## 2020-09-02 ENCOUNTER — Other Ambulatory Visit: Payer: Self-pay

## 2020-09-07 ENCOUNTER — Encounter: Payer: Self-pay | Admitting: Cardiology

## 2020-09-07 ENCOUNTER — Ambulatory Visit: Payer: BC Managed Care – PPO | Admitting: Cardiology

## 2020-09-07 ENCOUNTER — Other Ambulatory Visit: Payer: Self-pay

## 2020-09-07 VITALS — BP 140/72 | HR 80 | Ht 70.0 in | Wt 277.0 lb

## 2020-09-07 DIAGNOSIS — E669 Obesity, unspecified: Secondary | ICD-10-CM | POA: Diagnosis not present

## 2020-09-07 DIAGNOSIS — E785 Hyperlipidemia, unspecified: Secondary | ICD-10-CM

## 2020-09-07 DIAGNOSIS — R079 Chest pain, unspecified: Secondary | ICD-10-CM

## 2020-09-07 DIAGNOSIS — E782 Mixed hyperlipidemia: Secondary | ICD-10-CM | POA: Diagnosis not present

## 2020-09-07 DIAGNOSIS — E8881 Metabolic syndrome: Secondary | ICD-10-CM

## 2020-09-07 NOTE — Patient Instructions (Signed)
Medication Instructions:  Your physician recommends that you continue on your current medications as directed. Please refer to the Current Medication list given to you today.  *If you need a refill on your cardiac medications before your next appointment, please call your pharmacy*   Lab Work: None If you have labs (blood work) drawn today and your tests are completely normal, you will receive your results only by: Marland Kitchen MyChart Message (if you have MyChart) OR . A paper copy in the mail If you have any lab test that is abnormal or we need to change your treatment, we will call you to review the results.   Testing/Procedures: Non-Cardiac CT scanning, (CAT scanning), is a noninvasive, special x-ray that produces cross-sectional images of the body using x-rays and a computer. CT scans help physicians diagnose and treat medical conditions. For some CT exams, a contrast material is used to enhance visibility in the area of the body being studied. CT scans provide greater clarity and reveal more details than regular x-ray exams.     Follow-Up: At [facility], you and your health needs are our priority.  As part of our continuing mission to provide you with exceptional heart care, we have created designated Provider Care Teams.  These Care Teams include your primary Cardiologist (physician) and Advanced Practice Providers (APPs -  Physician Assistants and Nurse Practitioners) who all work together to provide you with the care you need, when you need it.  We recommend signing up for the patient portal called "MyChart".  Sign up information is provided on this After Visit Summary.  MyChart is used to connect with patients for Virtual Visits (Telemedicine).  Patients are able to view lab/test results, encounter notes, upcoming appointments, etc.  Non-urgent messages can be sent to your provider as well.   To learn more about what you can do with MyChart, go to [email or web address].    Your next  appointment:   6 month(s)  The format for your next appointment:   In Person  Provider:   Thomasene Ripple, DO   Other Instructions

## 2020-09-07 NOTE — Progress Notes (Signed)
Cardiology Office Note:    Date:  09/07/2020   ID:  Carlos Hendricks, DOB [birth year], MRN [id number]  PCP:  Mliss Sax, MD  Cardiologist:  Thomasene Ripple, DO  Electrophysiologist:  None   Referring MD: No ref. provider found   I am still having some chest pain.  History of Present Illness:    Carlos Hendricks is a 39 y.o. male with a hx of hypertriglyceridemia, initially presented on May 17, 2020 at that time he had been experiencing significant lightheadedness shortness of breath as well as intermittent chest pain.  During his visit I placed a monitor on the patient, and got an echocardiogram and also recommended he undergo a coronary CTA. In the interim the patient was able to get his ZIO monitor returned as well as get an echocardiogram.  His testings were essentially unremarkable. Unfortunately he did not get the coronary CTA as he was not ready for this testing. I also started the patient on Crestor due to dyslipidemia and hypertriglyceridemia but the patient had not started this medication either. He is here with his wife today he tells me that he has had some chest pain.  He described as intermittent last for few seconds and then resolved.  He has no shortness of breath with this.  Past Medical History:  Diagnosis Date  . Allergic contact dermatitis 03/29/2018  . BACK PAIN 07/05/2007   Qualifier: Diagnosis of  By: Drue Novel MD, Nolon Rod   . Class 3 obesity with body mass index (BMI) of 40.0 to 44.9 in adult 05/16/2017  . Hyperlipidemia 05/16/2017  . HYPOGONADISM 07/05/2007   Qualifier: Diagnosis of  By: Tyrone Apple, Lucy    . Hypotestosteronism 07/05/2007  . HYPOTHYROIDISM 07/05/2007   Qualifier: Diagnosis of  By: Tyrone Apple, Lucy    . Perennial allergic rhinitis with seasonal variation 09/04/2016    Past Surgical History:  Procedure Laterality Date  . POLYPECTOMY    . WISDOM TOOTH EXTRACTION      Current Medications: Current Meds  Medication Sig  . MITIGARE 0.6 MG  CAPS Take by mouth.  . nitroGLYCERIN (NITROSTAT) 0.4 MG SL tablet Place 1 tablet (0.4 mg total) under the tongue every 5 (five) minutes as needed.     Allergies:   Patient has no known allergies.   Social History   Socioeconomic History  . Marital status: Married    Spouse name: Not on file  . Number of children: Not on file  . Years of education: Not on file  . Highest education level: Not on file  Occupational History  . Not on file  Tobacco Use  . Smoking status: Never Smoker  . Smokeless tobacco: Never Used  Substance and Sexual Activity  . Alcohol use: Yes    Comment: rarely  . Drug use: Not Currently  . Sexual activity: Not on file  Other Topics Concern  . Not on file  Social History Narrative  . Not on file   Social Determinants of Health   Financial Resource Strain:   . Difficulty of Paying Living Expenses: Not on file  Food Insecurity:   . Worried About Programme researcher, broadcasting/film/video in the Last Year: Not on file  . Ran Out of Food in the Last Year: Not on file  Transportation Needs:   . Lack of Transportation (Medical): Not on file  . Lack of Transportation (Non-Medical): Not on file  Physical Activity:   . Days of Exercise per Week: Not on file  .  Minutes of Exercise per Session: Not on file  Stress:   . Feeling of Stress : Not on file  Social Connections:   . Frequency of Communication with Friends and Family: Not on file  . Frequency of Social Gatherings with Friends and Family: Not on file  . Attends Religious Services: Not on file  . Active Member of Clubs or Organizations: Not on file  . Attends Banker Meetings: Not on file  . Marital Status: Not on file     Family History: The patient's family history includes Diabetes in his father and paternal grandfather; Heart disease in his mother; Leukemia in his paternal grandfather.  ROS:   Review of Systems  Constitution: Negative for decreased appetite, fever and weight gain.  HENT: Negative for  congestion, ear discharge, hoarse voice and sore throat.   Eyes: Negative for discharge, redness, vision loss in right eye and visual halos.  Cardiovascular: Negative for chest pain, dyspnea on exertion, leg swelling, orthopnea and palpitations.  Respiratory: Negative for cough, hemoptysis, shortness of breath and snoring.   Endocrine: Negative for heat intolerance and polyphagia.  Hematologic/Lymphatic: Negative for bleeding problem. Does not bruise/bleed easily.  Skin: Negative for flushing, nail changes, rash and suspicious lesions.  Musculoskeletal: Negative for arthritis, joint pain, muscle cramps, myalgias, neck pain and stiffness.  Gastrointestinal: Negative for abdominal pain, bowel incontinence, diarrhea and excessive appetite.  Genitourinary: Negative for decreased libido, genital sores and incomplete emptying.  Neurological: Negative for brief paralysis, focal weakness, headaches and loss of balance.  Psychiatric/Behavioral: Negative for altered mental status, depression and suicidal ideas.  Allergic/Immunologic: Negative for HIV exposure and persistent infections.    EKGs/Labs/Other Studies Reviewed:    The following studies were reviewed today:   EKG: None today  Zio monitor  The patient wore the monitor for 12 days 17 hours starting May 27, 2020. Indication: Dizziness  The minimum heart rate was 49 bpm, maximum heart rate was 165 bpm, and average heart rate was 90 bpm. Predominant underlying rhythm was Sinus Rhythm.   Premature atrial complexes were rare less than 1%. Premature Ventricular complexes rare less than 1%.  No ventricular tachycardia, no pauses, No AV block, no supraventricular tachycardia and no atrial fibrillation present.  11 patient triggered events: 1 associated with premature atrial complex.  The remaining associated with sinus tachycardia.  5 diary events all associated with sinus tachycardia.  Conclusion: Unremarkable study with no  significant arrhythmia.   Transthoracic echocardiogram IMPRESSIONS  1. Left ventricular ejection fraction, by estimation, is 60 to 65%. The left ventricle has normal function. The left ventricle has no regional wall motion abnormalities. Left ventricular diastolic parameters were normal.  2. Right ventricular systolic function is normal. The right ventricular size is normal.  3. The mitral valve is normal in structure. No evidence of mitral valve regurgitation. No evidence of mitral stenosis.   FINDINGS  Left Ventricle: Left ventricular ejection fraction, by estimation, is 60  to 65%. The left ventricle has normal function. The left ventricle has no  regional wall motion abnormalities. The left ventricular internal cavity  size was normal in size. There is  no left ventricular hypertrophy. Left ventricular diastolic parameters  were normal.   Right Ventricle: The right ventricular size is normal. No increase in  right ventricular wall thickness. Right ventricular systolic function is  normal.   Left Atrium: Left atrial size was normal in size.   Right Atrium: Right atrial size was normal in size.   Pericardium:  There is no evidence of pericardial effusion.   Mitral Valve: The mitral valve is normal in structure. No evidence of  mitral valve regurgitation. No evidence of mitral valve stenosis.   Tricuspid Valve: The tricuspid valve is normal in structure. Tricuspid  valve regurgitation is not demonstrated. No evidence of tricuspid  stenosis.   Aortic Valve: The aortic valve is normal in structure. Aortic valve  regurgitation is not visualized. No aortic stenosis is present.   Pulmonic Valve: The pulmonic valve was normal in structure. Pulmonic valve  regurgitation is not visualized. No evidence of pulmonic stenosis.   Aorta: The aortic root is normal in size and structure.   Venous: The inferior vena cava is normal in size with greater than 50%  respiratory variability,  suggesting right atrial pressure of 3 mmHg.   IAS/Shunts: No atrial level shunt detected by color flow Doppler.    Recent Labs: 05/25/2020: Hemoglobin 15.0; Platelets 211 06/22/2020: BUN 12; Creatinine, Ser 1.03; Potassium 4.5; Sodium 145 07/22/2020: TSH 1.83  Recent Lipid Panel    Component Value Date/Time   CHOL 197 06/22/2020 0814   TRIG 238 (H) 06/22/2020 0814   HDL 34 (L) 06/22/2020 0814   CHOLHDL 5.8 (H) 06/22/2020 0814   LDLCALC 121 (H) 06/22/2020 2979    Physical Exam:    VS:  BP 140/72   Pulse 80   Ht 5\' 10"  (1.778 m)   Wt 277 lb (125.6 kg)   SpO2 98%   BMI 39.75 kg/m     Wt Readings from Last 3 Encounters:  09/07/20 277 lb (125.6 kg)  07/22/20 278 lb 9.6 oz (126.4 kg)  05/27/20 291 lb (132 kg)     GEN: Well nourished, well developed in no acute distress HEENT: Normal NECK: No JVD; No carotid bruits LYMPHATICS: No lymphadenopathy CARDIAC: S1S2 noted,RRR, no murmurs, rubs, gallops RESPIRATORY:  Clear to auscultation without rales, wheezing or rhonchi  ABDOMEN: Soft, non-tender, non-distended, +bowel sounds, no guarding. EXTREMITIES: No edema, No cyanosis, no clubbing MUSCULOSKELETAL:  No deformity  SKIN: Warm and dry NEUROLOGIC:  Alert and oriented x 3, non-focal PSYCHIATRIC:  Normal affect, good insight  ASSESSMENT:    1. Chest pain of uncertain etiology   2. Dyslipidemia (high LDL; low HDL)   3. Mixed hyperlipidemia   4. Obesity (BMI 30-39.9)   5. Metabolic syndrome    PLAN:      We discussed his testing results.  All of their questions has been answered.  He still does have some intermittent chest pain which I am concerned about.  But for now the patient preferred not to move forward with a coronary CTA.  I was able to discuss with the patient and his wife to at least consider getting a calcium scoring given his elevated triglyceridemia as well as dyslipidemia.  He has agreed to this. He also has not been taking her Crestor and for now he prefers  not to he rather diet modification.  Hoping that we can be able to get some information from his calcium scoring.  Ideally with his symptoms coronary CTA is the most appropriate step for the patient has declined this for now.  The patient is in agreement with the above plan. The patient left the office in stable condition.  The patient will follow up in 6 months sooner if needed peer   Medication Adjustments/Labs and Tests Ordered: Current medicines are reviewed at length with the patient today.  Concerns regarding medicines are outlined above.  Orders Placed  This Encounter  Procedures  . CT CARDIAC SCORING   No orders of the defined types were placed in this encounter.   Patient Instructions  Medication Instructions:  Your physician recommends that you continue on your current medications as directed. Please refer to the Current Medication list given to you today.  *If you need a refill on your cardiac medications before your next appointment, please call your pharmacy*   Lab Work: None If you have labs (blood work) drawn today and your tests are completely normal, you will receive your results only by: Marland Kitchen MyChart Message (if you have MyChart) OR . A paper copy in the mail If you have any lab test that is abnormal or we need to change your treatment, we will call you to review the results.   Testing/Procedures: Non-Cardiac CT scanning, (CAT scanning), is a noninvasive, special x-ray that produces cross-sectional images of the body using x-rays and a computer. CT scans help physicians diagnose and treat medical conditions. For some CT exams, a contrast material is used to enhance visibility in the area of the body being studied. CT scans provide greater clarity and reveal more details than regular x-ray exams.     Follow-Up: At [facility], you and your health needs are our priority.  As part of our continuing mission to provide you with exceptional heart care, we have created  designated Provider Care Teams.  These Care Teams include your primary Cardiologist (physician) and Advanced Practice Providers (APPs -  Physician Assistants and Nurse Practitioners) who all work together to provide you with the care you need, when you need it.  We recommend signing up for the patient portal called "MyChart".  Sign up information is provided on this After Visit Summary.  MyChart is used to connect with patients for Virtual Visits (Telemedicine).  Patients are able to view lab/test results, encounter notes, upcoming appointments, etc.  Non-urgent messages can be sent to your provider as well.   To learn more about what you can do with MyChart, go to [email or web address].    Your next appointment:   6 month(s)  The format for your next appointment:   In Person  Provider:   Thomasene Ripple, DO   Other Instructions      Adopting a Healthy Lifestyle.  Know what a healthy weight is for you (roughly BMI <25) and aim to maintain this   Aim for 7+ servings of fruits and vegetables daily   65-80+ fluid ounces of water or unsweet tea for healthy kidneys   Limit to max 1 drink of alcohol per day; avoid smoking/tobacco   Limit animal fats in diet for cholesterol and heart health - choose grass fed whenever available   Avoid highly processed foods, and foods high in saturated/trans fats   Aim for low stress - take time to unwind and care for your mental health   Aim for 150 min of moderate intensity exercise weekly for heart health, and weights twice weekly for bone health   Aim for 7-9 hours of sleep daily   When it comes to diets, agreement about the perfect plan isnt easy to find, even among the experts. Experts at the [facility] [employer] developed an idea known as the Healthy Eating Plate. Just imagine a plate divided into logical, healthy portions.   The emphasis is on diet quality:   Load up on vegetables and fruits - one-half of your plate: Aim for  color and variety, and remember that potatoes  dont count.   Go for whole grains - one-quarter of your plate: Whole wheat, barley, wheat berries, quinoa, oats, brown rice, and foods made with them. If you want pasta, go with whole wheat pasta.   Protein power - one-quarter of your plate: Fish, chicken, beans, and nuts are all healthy, versatile protein sources. Limit red meat.   The diet, however, does go beyond the plate, offering a few other suggestions.   Use healthy plant oils, such as olive, canola, soy, corn, sunflower and peanut. Check the labels, and avoid partially hydrogenated oil, which have unhealthy trans fats.   If youre thirsty, drink water. Coffee and tea are good in moderation, but skip sugary drinks and limit milk and dairy products to one or two daily servings.   The type of carbohydrate in the diet is more important than the amount. Some sources of carbohydrates, such as vegetables, fruits, whole grains, and beans-are healthier than others.   Finally, stay active  Signed, Thomasene RippleKardie Cele Mote, DO  09/07/2020 3:08 PM    [facility]

## 2020-09-24 ENCOUNTER — Other Ambulatory Visit: Payer: Self-pay

## 2020-09-24 ENCOUNTER — Ambulatory Visit (INDEPENDENT_AMBULATORY_CARE_PROVIDER_SITE_OTHER)
Admission: RE | Admit: 2020-09-24 | Discharge: 2020-09-24 | Disposition: A | Discharge status: Home / Self Care | Payer: Self-pay | Source: Ambulatory Visit | Attending: Cardiology | Admitting: Cardiology

## 2020-09-24 DIAGNOSIS — R079 Chest pain, unspecified: Secondary | ICD-10-CM

## 2020-09-25 ENCOUNTER — Telehealth: Payer: Self-pay

## 2020-09-25 NOTE — Telephone Encounter (Signed)
Left message on patients voicemail to please return our call.   

## 2020-09-25 NOTE — Telephone Encounter (Signed)
-----   Message from Kardie Tobb, DO sent at 09/25/2020 12:36 PM EST ----- Calcium score is 0 

## 2020-09-28 NOTE — Telephone Encounter (Signed)
Carlos Hendricks is returning [employer].

## 2020-09-28 NOTE — Telephone Encounter (Signed)
Results reviewed with pt as per Dr. Tobb's note.  Pt verbalized understanding and had no additional questions. Routed to PCP 

## 2020-10-05 ENCOUNTER — Telehealth: Payer: Self-pay

## 2020-10-05 NOTE — Telephone Encounter (Signed)
-----   Message from Thomasene Ripple, DO sent at 09/25/2020 12:36 PM EST ----- Calcium score is 0

## 2020-10-05 NOTE — Telephone Encounter (Signed)
Left message on patients voicemail to please return our call.   

## 2020-10-06 ENCOUNTER — Telehealth: Payer: BC Managed Care – PPO | Admitting: Physician Assistant

## 2020-10-06 DIAGNOSIS — M109 Gout, unspecified: Secondary | ICD-10-CM

## 2020-10-06 MED ORDER — MITIGARE 0.6 MG PO CAPS
ORAL_CAPSULE | ORAL | 0 refills | Status: DC
Start: 2020-10-06 — End: 2020-10-06

## 2020-10-06 MED ORDER — COLCHICINE 0.6 MG PO TABS: ORAL_TABLET | ORAL | 0 refills | Status: DC

## 2020-10-06 NOTE — Addendum Note (Signed)
Addended by: Dierdre Forth on: 10/06/2020 03:29 PM   Modules accepted: Orders

## 2020-10-06 NOTE — Progress Notes (Signed)
E-Visit for Gout  We are sorry that you are not feeling well. We are here to help!  Based on what you shared with me it looks like you have a flare of your gout.  Gout is a form of arthritis. It can cause pain and swelling in the joints. At first, it tends to affect only 1 joint - most frequently the big toe. It happens in people who have too much uric acid in the blood. Uric acid is a chemical that is produced when the body breaks down certain foods. Uric acid can form sharp needle-like crystals that build up in the joints and cause pain. Uric acid crystals can also form inside the tubes that carry urine from the kidneys to the bladder. These crystals can turn into "kidney stones" that can cause pain and problems with the flow of urine. People with gout get sudden "flares" or attacks of severe pain, most often the big toe, ankle, or knee. Often the joint also turns red and swells. Usually, only 1 joint is affected, but some people have pain in more than 1 joint. Gout flares tend to happen more often during the night.  The pain from gout can be extreme. The pain and swelling are worst at the beginning of a gout flare. The symptoms then get better within a few days to weeks. It is not clear how the body "turns off" a gout flare.  Do not start any NEW preventative medicine until the gout has cleared completely. However, If you are already on Probenecid or Allopurinol for CHRONIC gout, you may continue taking this during an active flare up  I have prescribed Colchicine 0.6 mg tabs - Take 2 tabs immediately, then 1 tab twice per day for the duration of the flare up to a max of 7 days (but discontinue for stomach pains or diarrhea)    HOME CARE Losing weight can help relieve gout. It's not clear that following a specific diet plan will help with gout symptoms but eating a balanced diet can help improve your overall health. It can also help you lose weight, if you are overweight. In general, a healthy diet  includes plenty of fruits, vegetables, whole grains, and low-fat dairy products (labelled "low fat", skim, 2%). Avoid sugar sweetened drinks (including sodas, tea, juice and juice blends, coffee drinks and sports drinks) Limit alcohol to 1-2 drinks of beer, spirits or wine daily these can make gout flares worse. Some people with gout also have other health problems, such as heart disease, high blood pressure, kidney disease, or obesity. If you have any of these issues, it's important to work with your doctor to manage them. This can help improve your overall health and might also help with your gout.  GET HELP RIGHT AWAY IF: . Your symptoms persist after you have completed your treatment plan . You develop severe diarrhea . You develop abnormal sensations .  You develop vomiting,  .  You develop weakness .  You develop abdominal pain  FOLLOW UP WITH YOUR PRIMARY PROVIDER IF: . If your symptoms do not improve within 10 days  MAKE SURE YOU   Understand these instructions.  Will watch your condition.  Will get help right away if you are not doing well or get worse.  Your e-visit answers were reviewed by a board certified advanced clinical practitioner to complete your personal care plan. Depending upon the condition, your plan could have included both over the counter or prescription medications.  Your  safety is important to [place]. If you have drug allergies check your prescription carefully.   You can use MyChart to ask questions about today's visit, request a non-urgent call back, or ask for a work or school excuse for 24 hours related to this e-Visit. If it has been greater than 24 hours you will need to follow up with your provider, or enter a new e-Visit to address those concerns. You will get an e-mail with a link to a survey asking about your experience.  We hope that your e-visit has been valuable and will speed your recovery! Thank you for using e-visits.      Greater than 5  minutes, yet less than 10 minutes of time have been spent researching, coordinating, and implementing care for this patient today

## 2021-02-04 ENCOUNTER — Telehealth: Payer: BC Managed Care – PPO | Admitting: Physician Assistant

## 2021-02-04 DIAGNOSIS — M109 Gout, unspecified: Secondary | ICD-10-CM

## 2021-02-05 MED ORDER — COLCHICINE 0.6 MG PO TABS: ORAL_TABLET | ORAL | 0 refills | Status: DC

## 2021-02-05 NOTE — Progress Notes (Signed)
I have spent 5 minutes in review of e-visit questionnaire, review and updating patient chart, medical decision making and response to patient.   Koki Buxton Cody Lurie Mullane, PA-C    

## 2021-02-05 NOTE — Progress Notes (Signed)
E-Visit for Gout  We are sorry that you are not feeling well. We are here to help!  Based on what you shared with me it looks like you have a flare of your gout.  Gout is a form of arthritis. It can cause pain and swelling in the joints. At first, it tends to affect only 1 joint - most frequently the big toe. It happens in people who have too much uric acid in the blood. Uric acid is a chemical that is produced when the body breaks down certain foods. Uric acid can form sharp needle-like crystals that build up in the joints and cause pain. Uric acid crystals can also form inside the tubes that carry urine from the kidneys to the bladder. These crystals can turn into "kidney stones" that can cause pain and problems with the flow of urine. People with gout get sudden "flares" or attacks of severe pain, most often the big toe, ankle, or knee. Often the joint also turns red and swells. Usually, only 1 joint is affected, but some people have pain in more than 1 joint. Gout flares tend to happen more often during the night.  The pain from gout can be extreme. The pain and swelling are worst at the beginning of a gout flare. The symptoms then get better within a few days to weeks. It is not clear how the body "turns off" a gout flare.  Do not start any NEW preventative medicine until the gout has cleared completely. However, If you are already on Probenecid or Allopurinol for CHRONIC gout, you may continue taking this during an active flare up  I have prescribed Colchicine 0.6 mg tabs - Take 2 tabs immediately, then 1 tab twice per day for the duration of the flare up to a max of 7 days (but discontinue for stomach pains or diarrhea)    HOME CARE Losing weight can help relieve gout. It's not clear that following a specific diet plan will help with gout symptoms but eating a balanced diet can help improve your overall health. It can also help you lose weight, if you are overweight. In general, a healthy diet  includes plenty of fruits, vegetables, whole grains, and low-fat dairy products (labelled "low fat", skim, 2%). Avoid sugar sweetened drinks (including sodas, tea, juice and juice blends, coffee drinks and sports drinks) Limit alcohol to 1-2 drinks of beer, spirits or wine daily these can make gout flares worse. Some people with gout also have other health problems, such as heart disease, high blood pressure, kidney disease, or obesity. If you have any of these issues, it's important to work with your doctor to manage them. This can help improve your overall health and might also help with your gout.  GET HELP RIGHT AWAY IF: . Your symptoms persist after you have completed your treatment plan . You develop severe diarrhea . You develop abnormal sensations .  You develop vomiting,  .  You develop weakness .  You develop abdominal pain  FOLLOW UP WITH YOUR PRIMARY PROVIDER IF: . If your symptoms do not improve within 10 days  MAKE SURE YOU   Understand these instructions.  Will watch your condition.  Will get help right away if you are not doing well or get worse.  Your e-visit answers were reviewed by a board certified advanced clinical practitioner to complete your personal care plan. Depending upon the condition, your plan could have included both over the counter or prescription medications.  Your  safety is important to [place]. If you have drug allergies check your prescription carefully.   You can use MyChart to ask questions about today's visit, request a non-urgent call back, or ask for a work or school excuse for 24 hours related to this e-Visit. If it has been greater than 24 hours you will need to follow up with your provider, or enter a new e-Visit to address those concerns. You will get an e-mail with a link to a survey asking about your experience.  We hope that your e-visit has been valuable and will speed your recovery! Thank you for using e-visits.

## 2021-03-02 ENCOUNTER — Ambulatory Visit: Payer: BC Managed Care – PPO | Admitting: Cardiology

## 2021-09-21 IMAGING — CT CT CARDIAC CORONARY ARTERY CALCIUM SCORE
3 series · 14 of 20 positions shown, 15 images · non-contrast
Comparison: None.
COMPARISON: None.

Addendum:
EXAM:
OVER-READ INTERPRETATION  CT CHEST

The following report is an over-read performed by radiologist Dr.
Benrabah Etoil [REDACTED] on 09/24/2020. This
over-read does not include interpretation of cardiac or coronary
anatomy or pathology. The coronary calcium score interpretation by
the cardiologist is attached.
CLINICAL DATA: Risk stratification
Coronary Calcium Score
TECHNIQUE: The patient was scanned on a Siemens Force scanner. Axial
non-contrast 3 mm slices were carried out through the heart. The
data set was analyzed on a dedicated work station and scored using
the Agatson method.

[Series 2: casc 3.0 bv41 2 bestdiast 75 % · axial · 0.37mm/px · z∈[-238,-160]mm · 4 of 44 slices shown, 5 images]
[im 9/44  vessel]
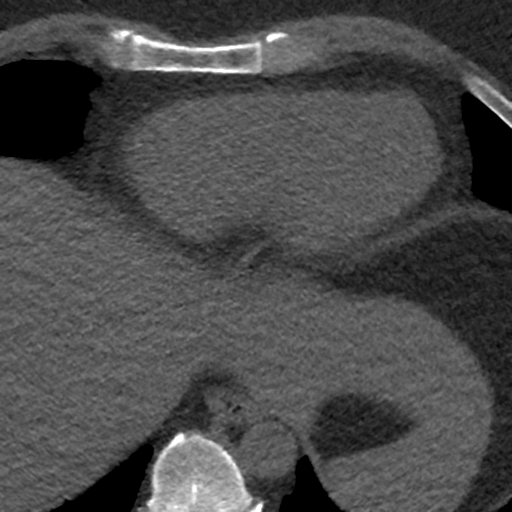
[im 9/44  lung]
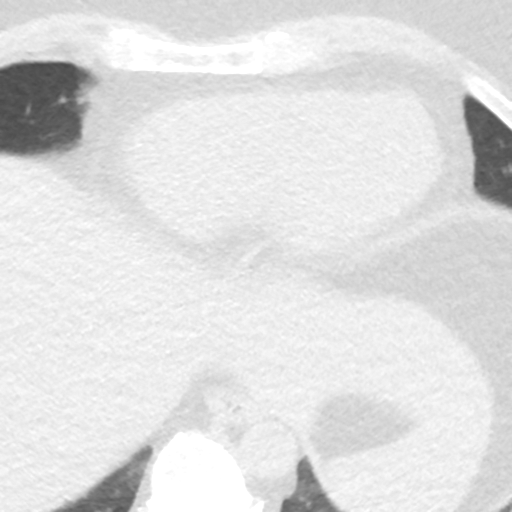
[im 18/44  vessel]
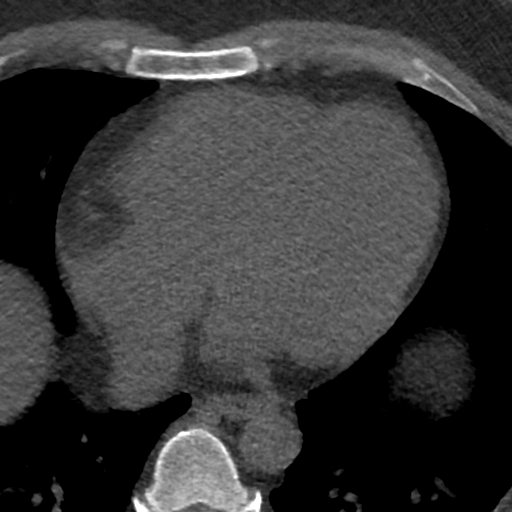
[im 26/44  vessel]
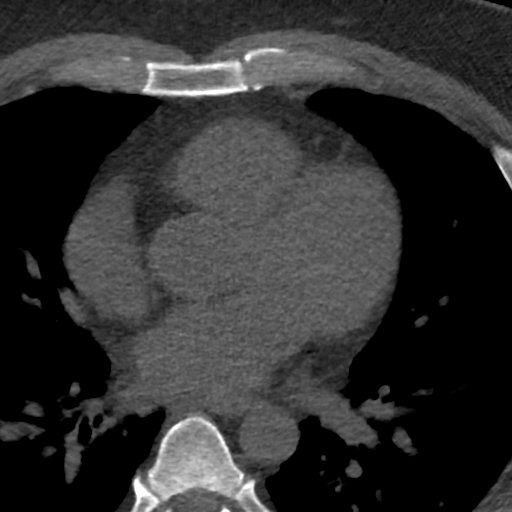
[im 35/44  vessel]
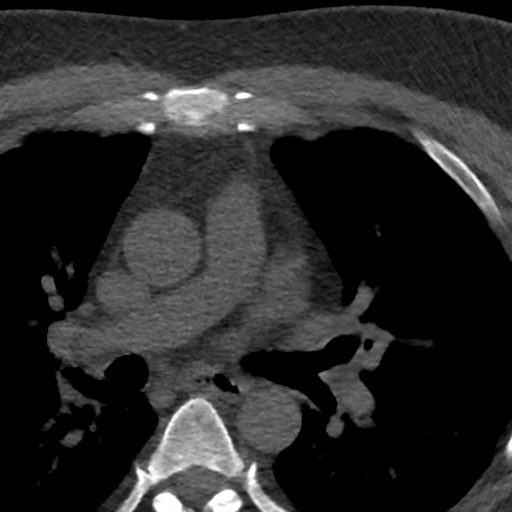

[Series 3: lung 75 % · axial · 0.71mm/px · z∈[-241,-157]mm · 5 of 44 slices shown]
[im 8/44  lung]
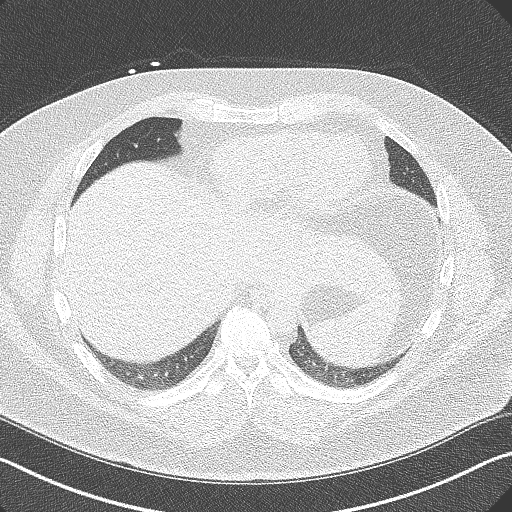
[im 15/44  lung]
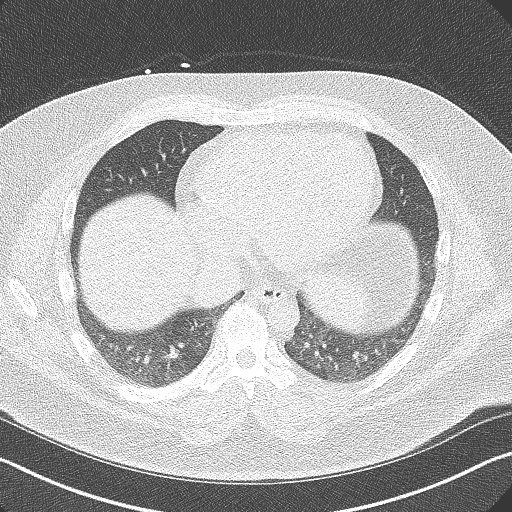
[im 22/44  lung]
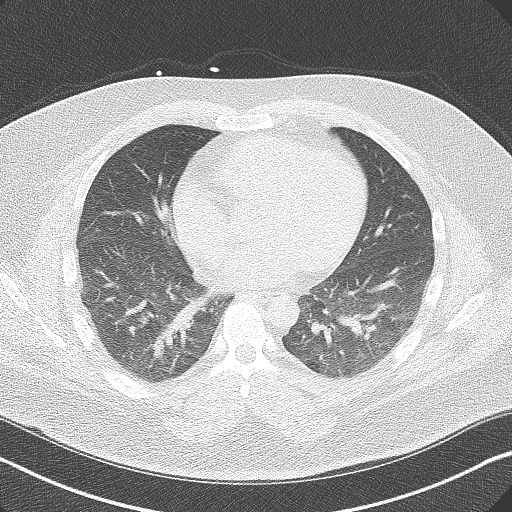
[im 29/44  lung]
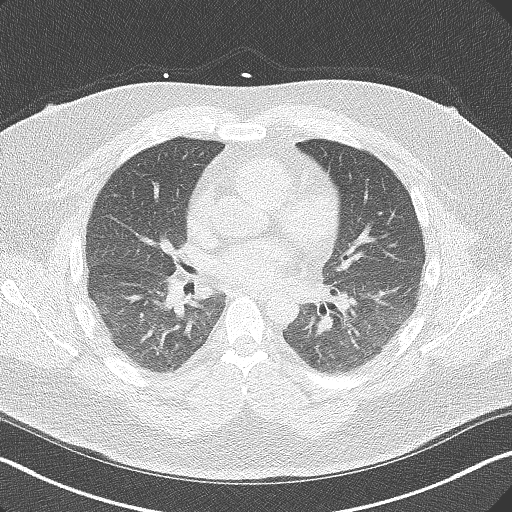
[im 36/44  lung]
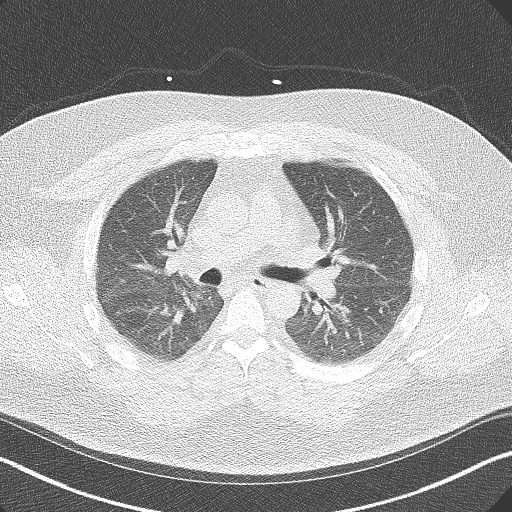

[Series 4: lung st 75 % · axial · 0.71mm/px · z∈[-241,-157]mm · 5 of 44 slices shown]
[im 8/44  lung]
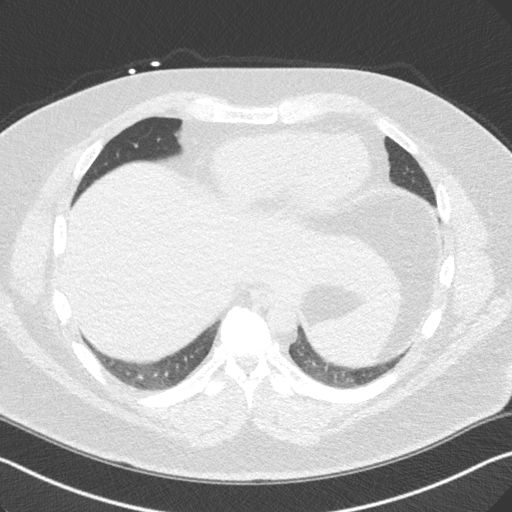
[im 15/44  lung]
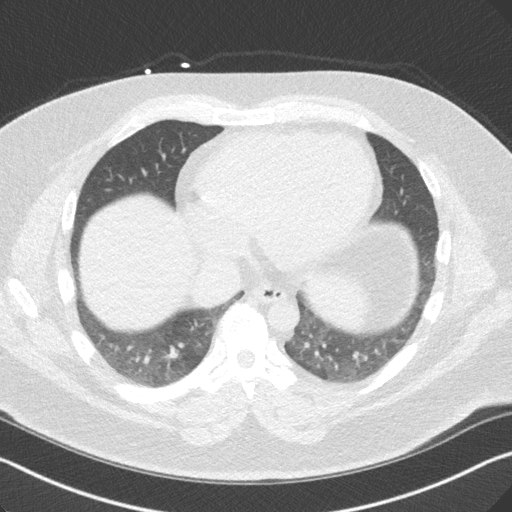
[im 22/44  lung]
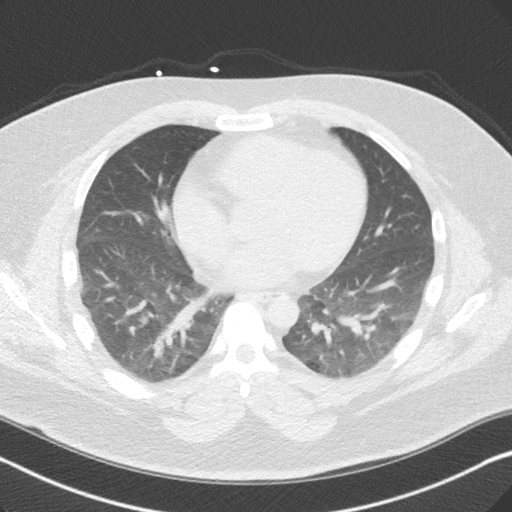
[im 29/44  lung]
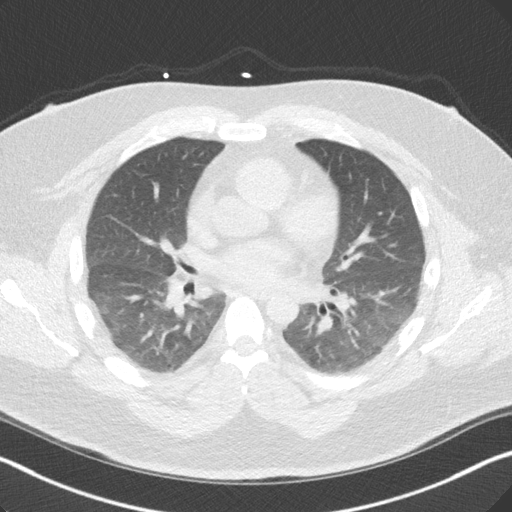
[im 36/44  lung]
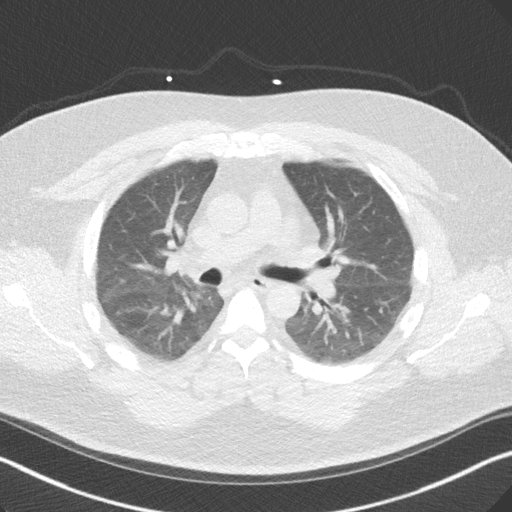

[14 of 20 positions shown; findings below may reference images not displayed]

FINDINGS: Within the visualized portions of the thorax there are no suspicious
appearing pulmonary nodules or masses, there is no acute
consolidative airspace disease, no pleural effusions, no
pneumothorax and no lymphadenopathy. Visualized portions of the
upper abdomen are unremarkable. There are no aggressive appearing
lytic or blastic lesions noted in the visualized portions of the
skeleton.
IMPRESSION: 1. No significant incidental noncardiac findings are noted.
FINDINGS: Non-cardiac: See separate report from [REDACTED].

Ascending Aorta: Normal

Pericardium: Normal

Coronary arteries: Normal origin.
IMPRESSION: Coronary calcium score of 0. This was percentile 0 for age and sex
matched control.

Reiner Yen,DO

*** End of Addendum ***
EXAM:
OVER-READ INTERPRETATION  CT CHEST

The following report is an over-read performed by radiologist Dr.
Benrabah Etoil [REDACTED] on 09/24/2020. This
over-read does not include interpretation of cardiac or coronary
anatomy or pathology. The coronary calcium score interpretation by
the cardiologist is attached.
FINDINGS: Within the visualized portions of the thorax there are no suspicious
appearing pulmonary nodules or masses, there is no acute
consolidative airspace disease, no pleural effusions, no
pneumothorax and no lymphadenopathy. Visualized portions of the
upper abdomen are unremarkable. There are no aggressive appearing
lytic or blastic lesions noted in the visualized portions of the
skeleton.
IMPRESSION: 1. No significant incidental noncardiac findings are noted.

## 2022-01-04 ENCOUNTER — Encounter: Payer: Self-pay | Admitting: Family Medicine

## 2022-01-04 ENCOUNTER — Ambulatory Visit: Payer: BC Managed Care – PPO | Admitting: Family Medicine

## 2022-01-04 ENCOUNTER — Ambulatory Visit: Payer: Self-pay | Admitting: Family Medicine

## 2022-01-04 VITALS — BP 120/68 | HR 105 | Temp 97.5°F | Ht 70.0 in | Wt 293.0 lb

## 2022-01-04 DIAGNOSIS — M928 Other specified juvenile osteochondrosis: Secondary | ICD-10-CM

## 2022-01-04 DIAGNOSIS — M7662 Achilles tendinitis, left leg: Secondary | ICD-10-CM

## 2022-01-04 MED ORDER — NAPROXEN 500 MG PO TABS: 500.0000 mg | ORAL_TABLET | Freq: Two times a day (BID) | ORAL | 0 refills | Status: DC

## 2022-01-04 NOTE — Patient Instructions (Signed)
Achilles Tendinitis Achilles tendinitis is inflammation of the tough, cord-like band that attaches the lower leg muscles to the heel bone (Achilles tendon). This is usually caused by overusing the tendon and the ankle joint. Achilles tendinitis usually gets better over time with treatment and caring for yourself at home. It can take weeks or months to heal completely. What are the causes? This condition may be caused by: A sudden increase in exercise or activity, such as running. Doing the same exercises or activities, such as jumping, over and over. Not warming up calf muscles before exercising. Exercising in shoes that are worn out or not made for exercise. Having arthritis or a bone growth (spur) on the back of the heel bone. This can rub against the tendon and hurt it. Age-related wear and tear. Tendons become less flexible with age and are more likely to be injured. What are the signs or symptoms? Common symptoms of this condition include: Pain in the Achilles tendon or in the back of the leg, just above the heel. The pain usually gets worse with exercise. Stiffness or soreness in the back of the leg, especially in the morning. Swelling of the skin over the Achilles tendon. Thickening of the tendon. Trouble standing on tiptoe. How is this diagnosed? This condition is diagnosed based on your symptoms and a physical exam. You may have tests, including: X-rays. MRI. How is this treated? The goal of treatment is to relieve symptoms and help your injury heal. Treatment may include: Decreasing or stopping activities that caused the tendinitis. This may mean switching to low-impact exercises like biking or swimming. Icing the injured area. Doing physical therapy, including strengthening and stretching exercises. Taking NSAIDs, such as ibuprofen, to help relieve pain and swelling. Using supportive shoes, wraps, heel lifts, or a walking boot (air cast). Having surgery. This may be done if  your symptoms do not improve after other treatments. Using high-energy shock wave impulses to stimulate the healing process (extracorporeal shock wave therapy). This is rare. Having an injection of medicines that help relieve inflammation (corticosteroids). This is rare. Follow these instructions at home: If you have an air cast: Wear the air cast as told by your health care provider. Remove it only as told by your health care provider. Loosen it if your toes tingle, become numb, or turn cold and blue. Keep it clean. If the air cast is not waterproof: Do not let it get wet. Cover it with a watertight covering when you take a bath or shower. Managing pain, stiffness, and swelling  If directed, put ice on the injured area. To do this: If you have a removable air cast, remove it as told by your health care provider. Put ice in a plastic bag. Place a towel between your skin and the bag. Leave the ice on for 20 minutes, 2-3 times a day. Move your toes often to reduce stiffness and swelling. Raise (elevate) your foot above the level of your heart while you are sitting or lying down. Activity Gradually return to your normal activities as told by your health care provider. Ask your health care provider what activities are safe for you. Do not do activities that cause pain. Consider doing low-impact exercises, like cycling or swimming. Ask your health care provider when it is safe to drive if you have an air cast on your foot. If physical therapy was prescribed, do exercises as told by your health care provider or physical therapist. General instructions If directed, wrap your foot   with an elastic bandage or other wrap. This can help to keep your tendon from moving too much while it heals. Your health care provider will show you how to wrap your foot correctly. Wear supportive shoes or heel lifts only as told by your health care provider. Take over-the-counter and prescription medicines only as  told by your health care provider. Keep all follow-up visits as told by your health care provider. This is important. Contact a health care provider if you: Have symptoms that get worse. Have pain that does not get better with medicine. Develop new, unexplained symptoms. Develop warmth and swelling in your foot. Have a fever. Get help right away if you: Have a sudden popping sound or sensation in your Achilles tendon followed by severe pain. Cannot move your toes or foot. Cannot put any weight on your foot. Your foot or toes become numb and look white or blue even after loosening your bandage or air cast. Summary Achilles tendinitis is inflammation of the tough, cord-like band that attaches the lower leg muscles to the heel bone (Achilles tendon). This condition is usually caused by overusing the tendon and the ankle joint. It can also be caused by arthritis or normal aging. The most common symptoms of this condition include pain, swelling, or stiffness in the Achilles tendon or in the back of the leg. This condition is usually treated by decreasing or stopping activities that caused the tendinitis, icing the injured area, taking NSAIDs, and doing physical therapy. This information is not intended to replace advice given to you by your health care provider. Make sure you discuss any questions you have with your health care provider. Document Revised: 02/11/2019 Document Reviewed: 02/11/2019 Elsevier Patient Education  2022 [employer]  

## 2022-01-04 NOTE — Progress Notes (Signed)
?[facility] ?[facility] ?[address] ?[place] [address] ?Dept: [phone] ?Dept Fax: [phone] ? ?Office Visit ? ?Subjective:  ? ? Patient ID: Carlos Hendricks, male    DOB: May 18, 1981, 41 y.o..   MRN: [id number] ? ?Chief Complaint  ?Patient presents with  ? Acute Visit  ?  C/o having RT foot pain x 4 days. No known injury. Has taken Ibuprofen.   ? ? ?History of Present Illness: ? ?Patient is in today complaining of his left ankle hurting. He notes that a few days ago, he was showing his daughter how to do a cartwheel. He did not attempt a full cartwheel, but did hop across on his ahnds landing on his feet. Afterwards, he noted some discomfort int he left posterior ankle area. This morning, he was also noting some anterior ankle pain. He has not noted significant swelling. He did take some ibuprofen. He notes he stayed home from work this morning, and his ankle does feel better than when he got up. ? ?Past Medical History: ?Patient Active Problem List  ? Diagnosis Date Noted  ? Gastroesophageal reflux disease 07/22/2020  ? Elevated glucose 07/22/2020  ? Dizziness 05/27/2020  ? Bilateral leg edema 05/27/2020  ? Chest pain 05/27/2020  ? Morbid obesity (Scalp Level) 05/27/2020  ? Right bundle branch block 05/27/2020  ? Allergic contact dermatitis 03/29/2018  ? Class 3 obesity with body mass index (BMI) of 40.0 to 44.9 in adult 05/16/2017  ? Healthcare maintenance 05/16/2017  ? Dyslipidemia 05/16/2017  ? Hyperlipidemia 05/16/2017  ? Perennial allergic rhinitis with seasonal variation 09/04/2016  ? Hypothyroidism 07/05/2007  ? HYPOGONADISM 07/05/2007  ? BACK PAIN 07/05/2007  ? Hypotestosteronism 07/05/2007  ? ?Past Surgical History:  ?Procedure Laterality Date  ? POLYPECTOMY    ? WISDOM TOOTH EXTRACTION    ? ?Family History  ?Problem Relation Age of Onset  ? Heart disease Mother   ? Diabetes Father   ? Diabetes Paternal Grandfather   ? Leukemia Paternal Grandfather    ? ?Outpatient Medications Prior to Visit  ?Medication Sig Dispense Refill  ? colchicine 0.6 MG tablet Take 2 tabs immediately, then 1 tab twice per day for the duration of the flare up to a max of 7 days (but discontinue for stomach pains or diarrhea) 16 tablet 0  ? nitroGLYCERIN (NITROSTAT) 0.4 MG SL tablet Place 1 tablet (0.4 mg total) under the tongue every 5 (five) minutes as needed. 25 tablet 6  ? omeprazole (PRILOSEC) 20 MG capsule Take 1 capsule (20 mg total) by mouth daily. 90 capsule 1  ? rosuvastatin (CRESTOR) 5 MG tablet Take 1 tablet (5 mg total) by mouth daily. (Patient not taking: Reported on 07/22/2020) 90 tablet 3  ? ?No facility-administered medications prior to visit.  ? ?No Known Allergies ?   ?Objective:  ? ?Today's Vitals  ? 01/04/22 1302  ?BP: 120/68  ?Pulse: (!) 105  ?Temp: (!) 97.5 ?F (36.4 ?C)  ?TempSrc: Temporal  ?SpO2: 96%  ?Weight: 293 lb (132.9 kg)  ?Height: 5\' 10"  (1.778 m)  ? ?Body mass index is 42.04 kg/m?.  ? ?General: Well developed, well nourished. No acute distress. ?Extremities: Full ROM of left ankle. No joint swelling. No pain over the gastrocnemius/soleus. There is  ? tenderness over the insertion site of the Achilles tendon on the calcaneus. No pain over the calcaneal  ? bursa. No pain over either malleoli. Mild discomfort over the anterior joint line. ?Psych: Alert and oriented. Normal mood and  affect. ? ?Health Maintenance Due  ?Topic Date Due  ? HIV Screening  Never done  ? Hepatitis C Screening  Never done  ?   ?Assessment & Plan:  ? ?1. Apophysitis of right calcaneus ?Recommend relative rest, icing for 10-15 min 3-4 times a day, elevation, and wrapping with an ACE (3" ACE provided). I will have him use naproxen twice a day for inflammation. If not improving by next week, he should follow up. ? ?- naproxen (NAPROSYN) 500 MG tablet; Take 1 tablet (500 mg total) by mouth 2 (two) times daily with a meal.  Dispense: 10 tablet; Refill: 0 ? ?Return if symptoms worsen or fail  to improve.  ? ?Haydee Salter, MD ?

## 2023-01-30 NOTE — Telephone Encounter (Signed)
na

## 2023-02-28 ENCOUNTER — Encounter: Payer: Self-pay | Admitting: Family Medicine

## 2023-02-28 ENCOUNTER — Ambulatory Visit: Payer: BC Managed Care – PPO | Admitting: Family Medicine

## 2023-02-28 VITALS — BP 128/74 | HR 89 | Temp 97.6°F | Ht 70.0 in | Wt 287.4 lb

## 2023-02-28 DIAGNOSIS — Z0001 Encounter for general adult medical examination with abnormal findings: Secondary | ICD-10-CM | POA: Diagnosis not present

## 2023-02-28 DIAGNOSIS — E790 Hyperuricemia without signs of inflammatory arthritis and tophaceous disease: Secondary | ICD-10-CM | POA: Diagnosis not present

## 2023-02-28 DIAGNOSIS — Z0000 Encounter for general adult medical examination without abnormal findings: Secondary | ICD-10-CM

## 2023-02-28 DIAGNOSIS — R6882 Decreased libido: Secondary | ICD-10-CM | POA: Diagnosis not present

## 2023-02-28 DIAGNOSIS — R7309 Other abnormal glucose: Secondary | ICD-10-CM

## 2023-02-28 DIAGNOSIS — M67441 Ganglion, right hand: Secondary | ICD-10-CM | POA: Diagnosis not present

## 2023-02-28 NOTE — Progress Notes (Signed)
Established Patient Office Visit   Subjective:  Patient ID: Carlos Hendricks, male    DOB: [birth year]  Age: 42 y.o. MRN: [id number]  Chief Complaint  Patient presents with   Cyst    Lump on right wrist x 2 year no change in size. Patient would like labs today. Patient not fasting.     HPI Encounter Diagnoses  Name Primary?   Healthcare maintenance Yes   Libido, decreased    Elevated glucose    Elevated uric acid in blood    Ganglion cyst of joint of finger of right hand    Here for physical and follow-up of above.  Stable mass on the dorsum of his right wrist.  He is RHD.  He works with computers throughout the day.  Mass is not painful.  Decrease in libido.  His wife is concerned.  They have a 64 year old son and 75-year-old daughter.  Suspected gouty attacks in the past but none recently.  Glucose has been elevated in the past with A1c in the low prediabetic range.  He is starting to exercise Synacort by walking at lunch.  Has regular dental care.   Review of Systems  Constitutional: Negative.   HENT: Negative.    Eyes:  Negative for blurred vision, discharge and redness.  Respiratory: Negative.    Cardiovascular: Negative.   Gastrointestinal:  Negative for abdominal pain.  Genitourinary: Negative.   Musculoskeletal: Negative.  Negative for myalgias.  Skin:  Negative for rash.  Neurological:  Negative for tingling, loss of consciousness and weakness.  Endo/Heme/Allergies:  Negative for polydipsia.      02/28/2023    4:01 PM 01/04/2022    1:02 PM 07/22/2020   11:29 AM  Depression screen PHQ 2/9  Decreased Interest 0 0 0  Down, Depressed, Hopeless 0 0 0  PHQ - 2 Score 0 0 0  Altered sleeping   0  Tired, decreased energy   0  Change in appetite   0  Feeling bad or failure about yourself    0  Trouble concentrating   0  Moving slowly or fidgety/restless   0  Suicidal thoughts   0  PHQ-9 Score   0  Difficult doing work/chores   Not difficult at all      No  current outpatient medications on file.   Objective:     BP 128/74 (BP Location: Right Arm, Patient Position: Sitting, Cuff Size: Normal)   Pulse 89   Temp 97.6 F (36.4 C) (Temporal)   Ht 5\' 10"  (1.778 m)   Wt 287 lb 6.4 oz (130.4 kg)   SpO2 97%   BMI 41.24 kg/m  Wt Readings from Last 3 Encounters:  02/28/23 287 lb 6.4 oz (130.4 kg)  01/04/22 293 lb (132.9 kg)  09/07/20 277 lb (125.6 kg)      Physical Exam Constitutional:      General: He is not in acute distress.    Appearance: Normal appearance. He is not ill-appearing, toxic-appearing or diaphoretic.  HENT:     Head: Normocephalic and atraumatic.     Right Ear: External ear normal.     Left Ear: External ear normal.     Mouth/Throat:     Mouth: Mucous membranes are moist.     Pharynx: Oropharynx is clear. No oropharyngeal exudate or posterior oropharyngeal erythema.  Eyes:     General: No scleral icterus.       Right eye: No discharge.        Left  eye: No discharge.     Extraocular Movements: Extraocular movements intact.     Conjunctiva/sclera: Conjunctivae normal.     Pupils: Pupils are equal, round, and reactive to light.  Cardiovascular:     Rate and Rhythm: Normal rate and regular rhythm.  Pulmonary:     Effort: Pulmonary effort is normal. No respiratory distress.     Breath sounds: Normal breath sounds.  Abdominal:     General: Bowel sounds are normal.     Tenderness: There is no abdominal tenderness. There is no guarding.     Hernia: There is no hernia in the left inguinal area or right inguinal area.  Genitourinary:    Penis: Circumcised. No hypospadias, erythema, tenderness, discharge, swelling or lesions.      Testes:        Right: Mass, tenderness or swelling not present. Right testis is descended.        Left: Mass, tenderness or swelling not present. Left testis is descended.     Epididymis:     Right: Not inflamed or enlarged.     Left: Not inflamed or enlarged.  Musculoskeletal:     Left  hand: Swelling present. No tenderness or bony tenderness. Normal range of motion. Normal strength.       Arms:     Cervical back: No rigidity or tenderness.  Lymphadenopathy:     Lower Body: No right inguinal adenopathy. No left inguinal adenopathy.  Skin:    General: Skin is warm and dry.  Neurological:     Mental Status: He is alert and oriented to person, place, and time.  Psychiatric:        Mood and Affect: Mood normal.        Behavior: Behavior normal.      No results found for any visits on 02/28/23.    The 10-year ASCVD risk score (Arnett DK, et al., 2019) is: 2.9%    Assessment & Plan:   Healthcare maintenance -     CBC; Future -     Comprehensive metabolic panel; Future -     Lipid panel; Future -     Urinalysis, Routine w reflex microscopic; Future  Libido, decreased -     Testosterone Total,Free,Bio, Males; Future  Elevated glucose -     Hemoglobin A1c; Future  Elevated uric acid in blood -     Uric acid; Future  Ganglion cyst of joint of finger of right hand -     Ambulatory referral to Orthopedic Surgery    Return Return fasting please for ordered blood work..    Recommended follow-up of pending results of today's labs.  Encouraged patient to increase exercise up to 300 minutes weekly for weight loss.  Information was given on exercising to lose weight.  Information also given on health maintenance and disease prevention. Mliss Sax, MD

## 2023-03-08 ENCOUNTER — Other Ambulatory Visit (INDEPENDENT_AMBULATORY_CARE_PROVIDER_SITE_OTHER): Payer: BC Managed Care – PPO

## 2023-03-08 DIAGNOSIS — R7309 Other abnormal glucose: Secondary | ICD-10-CM

## 2023-03-08 DIAGNOSIS — R6882 Decreased libido: Secondary | ICD-10-CM

## 2023-03-08 DIAGNOSIS — Z0000 Encounter for general adult medical examination without abnormal findings: Secondary | ICD-10-CM

## 2023-03-08 DIAGNOSIS — E790 Hyperuricemia without signs of inflammatory arthritis and tophaceous disease: Secondary | ICD-10-CM | POA: Diagnosis not present

## 2023-03-08 LAB — URINALYSIS, ROUTINE W REFLEX MICROSCOPIC
Bilirubin Urine: NEGATIVE
Hgb urine dipstick: NEGATIVE
Ketones, ur: NEGATIVE
Leukocytes,Ua: NEGATIVE
Nitrite: NEGATIVE
RBC / HPF: NONE SEEN (ref 0–?)
Specific Gravity, Urine: >=1.03 — AB (ref 1.000–1.030)
Total Protein, Urine: NEGATIVE
Urine Glucose: NEGATIVE
Urobilinogen, UA: 0.2 (ref 0.0–1.0)
WBC, UA: NONE SEEN (ref 0–?)
pH: 5.5 (ref 5.0–8.0)

## 2023-03-08 LAB — LDL CHOLESTEROL, DIRECT: Direct LDL: 146 mg/dL

## 2023-03-08 LAB — CBC
HCT: 44.8 % (ref 39.0–52.0)
Hemoglobin: 14.9 g/dL (ref 13.0–17.0)
MCHC: 33.3 g/dL (ref 30.0–36.0)
MCV: 98.1 fl (ref 78.0–100.0)
Platelets: 190 10*3/uL (ref 150.0–400.0)
RBC: 4.57 Mil/uL (ref 4.22–5.81)
RDW: 13.1 % (ref 11.5–15.5)
WBC: 5.6 10*3/uL (ref 4.0–10.5)

## 2023-03-08 LAB — LIPID PANEL
Cholesterol: 212 mg/dL — ABNORMAL HIGH (ref 0–200)
HDL: 34.4 mg/dL — ABNORMAL LOW (ref >39.00)
NonHDL: 177.76
Total CHOL/HDL Ratio: 6
Triglycerides: 225 mg/dL — ABNORMAL HIGH (ref 0.0–149.0)
VLDL: 45 mg/dL — ABNORMAL HIGH (ref 0.0–40.0)

## 2023-03-08 LAB — COMPREHENSIVE METABOLIC PANEL
ALT: 31 U/L (ref 0–53)
AST: 24 U/L (ref 0–37)
Albumin: 4.3 g/dL (ref 3.5–5.2)
Alkaline Phosphatase: 55 U/L (ref 39–117)
BUN: 17 mg/dL (ref 6–23)
CO2: 27 mEq/L (ref 19–32)
Calcium: 9.3 mg/dL (ref 8.4–10.5)
Chloride: 107 mEq/L (ref 96–112)
Creatinine, Ser: 1.1 mg/dL (ref 0.40–1.50)
GFR: 81.78 mL/min (ref >60.00)
Glucose, Bld: 102 mg/dL — ABNORMAL HIGH (ref 70–99)
Potassium: 4.1 mEq/L (ref 3.5–5.1)
Sodium: 140 mEq/L (ref 135–145)
Total Bilirubin: 1.1 mg/dL (ref 0.2–1.2)
Total Protein: 7.1 g/dL (ref 6.0–8.3)

## 2023-03-08 LAB — URIC ACID: Uric Acid, Serum: 9.1 mg/dL — ABNORMAL HIGH (ref 4.0–7.8)

## 2023-03-08 LAB — HEMOGLOBIN A1C: Hgb A1c MFr Bld: 5.6 % (ref 4.6–6.5)

## 2023-03-09 LAB — TESTOSTERONE TOTAL,FREE,BIO, MALES
Albumin: 4.3 g/dL (ref 3.6–5.1)
Sex Hormone Binding: 26 nmol/L (ref 10–50)
Testosterone, Bioavailable: 137.2 ng/dL (ref 110.0–575.0)
Testosterone, Free: 69.7 pg/mL (ref 46.0–224.0)
Testosterone: 429 ng/dL (ref 250–827)

## 2024-07-02 ENCOUNTER — Telehealth: Admitting: Physician Assistant

## 2024-07-02 DIAGNOSIS — M10072 Idiopathic gout, left ankle and foot: Secondary | ICD-10-CM | POA: Diagnosis not present

## 2024-07-03 MED ORDER — COLCHICINE 0.6 MG PO TABS: ORAL_TABLET | ORAL | 0 refills | Status: AC

## 2024-07-03 NOTE — Progress Notes (Signed)
 E-Visit for Gout Symptoms  We are sorry that you are not feeling well. We are here to help!  Based on what you shared with me it looks like you have a flare of your gout.  Gout is a form of arthritis. It can cause pain and swelling in the joints. At first, it tends to affect only 1 joint - most frequently the big toe. It happens in people who have too much uric acid in the blood. Uric acid is a chemical that is produced when the body breaks down certain foods. Uric acid can form sharp needle-like crystals that build up in the joints and cause pain. Uric acid crystals can also form inside the tubes that carry urine from the kidneys to the bladder. These crystals can turn into "kidney stones" that can cause pain and problems with the flow of urine. People with gout get sudden "flares" or attacks of severe pain, most often the big toe, ankle, or knee. Often the joint also turns red and swells. Usually, only 1 joint is affected, but some people have pain in more than 1 joint. Gout flares tend to happen more often during the night.  The pain from gout can be extreme. The pain and swelling are worst at the beginning of a gout flare. The symptoms then get better within a few days to weeks. It is not clear how the body "turns off" a gout flare.  Do not start any NEW preventative medicine until the gout has cleared completely. However, If you are already on Probenecid or Allopurinol for CHRONIC gout, you may continue taking this during an active flare up  I have prescribed Colchicine 0.6 mg tabs - Take 2 tabs immediately, then 1 tab twice per day for the duration of the flare up to a max of 7 days (but discontinue for stomach pains or diarrhea)    HOME CARE Losing weight can help relieve gout. It's not clear that following a specific diet plan will help with gout symptoms but eating a balanced diet can help improve your overall health. It can also help you lose weight, if you are overweight. In general, a  healthy diet includes plenty of fruits, vegetables, whole grains, and low-fat dairy products (labelled "low fat", skim, 2%). Avoid sugar sweetened drinks (including sodas, tea, juice and juice blends, coffee drinks and sports drinks) Limit alcohol to 1-2 drinks of beer, spirits or wine daily these can make gout flares worse. Some people with gout also have other health problems, such as heart disease, high blood pressure, kidney disease, or obesity. If you have any of these issues, it's important to work with your doctor to manage them. This can help improve your overall health and might also help with your gout.  GET HELP RIGHT AWAY IF: Your symptoms persist after you have completed your treatment plan You develop severe diarrhea You develop abnormal sensations  You develop vomiting,   You develop weakness  You develop abdominal pain  FOLLOW UP WITH YOUR PRIMARY PROVIDER IF: If your symptoms do not improve within 10 days  MAKE SURE YOU  Understand these instructions. Will watch your condition. Will get help right away if you are not doing well or get worse.  Thank you for choosing an e-visit.  Your e-visit answers were reviewed by a board certified advanced clinical practitioner to complete your personal care plan. Depending upon the condition, your plan could have included both over the counter or prescription medications.  Please review your  pharmacy choice. Make sure the pharmacy is open so you can pick up prescription now. If there is a problem, you may contact your provider through [employer] and have the prescription routed to another pharmacy.  Your safety is important to [place]. If you have drug allergies check your prescription carefully.   For the next 24 hours you can use MyChart to ask questions about today's visit, request a non-urgent call back, or ask for a work or school excuse. You will get an email in the next two days asking about your experience. I hope that your  e-visit has been valuable and will speed your recovery.    I have spent 5 minutes in review of e-visit questionnaire, review and updating patient chart, medical decision making and response to patient.   Margaretann Loveless, PA-C
